# Patient Record
Sex: Male | Born: 1955 | Race: Black or African American | Hispanic: No | Marital: Married | Smoking: Current every day smoker
Health system: Southern US, Community
[De-identification: ages and names within clinical notes are randomized; demographics above are authoritative.]

## PROBLEM LIST (undated history)

## (undated) DIAGNOSIS — C61 Malignant neoplasm of prostate: Secondary | ICD-10-CM

## (undated) DIAGNOSIS — K219 Gastro-esophageal reflux disease without esophagitis: Secondary | ICD-10-CM

## (undated) DIAGNOSIS — M109 Gout, unspecified: Secondary | ICD-10-CM

## (undated) HISTORY — PX: COLONOSCOPY: SHX174

## (undated) HISTORY — PX: PROSTATE BIOPSY: SHX241

---

## 2004-11-04 ENCOUNTER — Ambulatory Visit: Payer: Self-pay | Admitting: Oncology

## 2005-01-05 ENCOUNTER — Ambulatory Visit: Payer: Self-pay | Admitting: Oncology

## 2012-03-21 ENCOUNTER — Ambulatory Visit
Admission: RE | Admit: 2012-03-21 | Discharge: 2012-03-21 | Disposition: A | Discharge status: Home / Self Care | Payer: BC Managed Care – PPO | Source: Ambulatory Visit | Attending: Orthopedic Surgery | Admitting: Orthopedic Surgery

## 2012-03-21 ENCOUNTER — Other Ambulatory Visit: Payer: Self-pay | Admitting: Orthopedic Surgery

## 2012-03-21 DIAGNOSIS — M2560 Stiffness of unspecified joint, not elsewhere classified: Secondary | ICD-10-CM

## 2012-03-21 DIAGNOSIS — M25562 Pain in left knee: Secondary | ICD-10-CM

## 2013-02-25 ENCOUNTER — Encounter (HOSPITAL_COMMUNITY): Payer: Self-pay | Admitting: Emergency Medicine

## 2013-02-25 ENCOUNTER — Emergency Department (HOSPITAL_COMMUNITY)
Admission: EM | Admit: 2013-02-25 | Discharge: 2013-02-25 | Disposition: A | Discharge status: Home / Self Care | Payer: BC Managed Care – PPO | Attending: Emergency Medicine | Admitting: Emergency Medicine

## 2013-02-25 DIAGNOSIS — M109 Gout, unspecified: Secondary | ICD-10-CM

## 2013-02-25 DIAGNOSIS — M25569 Pain in unspecified knee: Secondary | ICD-10-CM | POA: Insufficient documentation

## 2013-02-25 DIAGNOSIS — F17200 Nicotine dependence, unspecified, uncomplicated: Secondary | ICD-10-CM | POA: Insufficient documentation

## 2013-02-25 DIAGNOSIS — M25469 Effusion, unspecified knee: Secondary | ICD-10-CM | POA: Insufficient documentation

## 2013-02-25 HISTORY — DX: Gout, unspecified: M10.9

## 2013-02-25 MED ORDER — OXYCODONE-ACETAMINOPHEN 5-325 MG PO TABS: 2.0000 | ORAL_TABLET | ORAL | Status: DC | PRN

## 2013-02-25 MED ORDER — IBUPROFEN 800 MG PO TABS: 800.0000 mg | ORAL_TABLET | Freq: Three times a day (TID) | ORAL | Status: DC

## 2013-02-25 MED ORDER — COLCHICINE 0.6 MG PO TABS: 0.6000 mg | ORAL_TABLET | Freq: Two times a day (BID) | ORAL | Status: DC | PRN

## 2013-02-25 NOTE — ED Provider Notes (Signed)
CSN: [id number]     Arrival date & time 02/25/13  0524 History   First MD Initiated Contact with Patient 02/25/13 0532     Chief Complaint  Patient presents with  . Leg Pain   (Consider location/radiation/quality/duration/timing/severity/associated sxs/prior Treatment) HPI Comments: Presents to the ER for evaluation of knee pain. Patient reports that he had onset of swelling in the left knee yesterday. Swelling progressively worsened and he is having constant pain in the knee, but the pain shoots up his leg. He says he has a history of gout, but is concerned he might have a blood clot. He has no history of blood clots. Patient denies any direct injury to the area.  Patient is a 58 y.o. male presenting with leg pain.  Leg Pain   Past Medical History  Diagnosis Date  . Gout    History reviewed. No pertinent past surgical history. No family history on file. History  Substance Use Topics  . Smoking status: Current Every Day Smoker  . Smokeless tobacco: Not on file  . Alcohol Use: No    Review of Systems  Musculoskeletal: Positive for arthralgias.  Skin: Negative.     Allergies  Review of patient's allergies indicates no known allergies.  Home Medications  No current outpatient prescriptions on file. BP 149/79  Pulse 106  Temp(Src) 99.2 F (37.3 C) (Oral)  Resp 18  SpO2 96% Physical Exam  Musculoskeletal:       Left knee: He exhibits swelling and effusion. He exhibits normal range of motion, no ecchymosis, no deformity, no laceration and no erythema. Tenderness found.  Neurological: No cranial nerve deficit or sensory deficit.  Skin: Skin is warm, dry and intact. No ecchymosis and no rash noted. No erythema.    ED Course  Procedures (including critical care time) Labs Review Labs Reviewed - No data to display Imaging Review No results found.  EKG Interpretation   None       MDM  Diagnosis: Gout, left knee  Patient presents to the ER for evaluation of pain  and swelling of the left knee. Patient has a history of gout, predominantly in his feet in the past. He takes allopurinol daily. Patient's examination reveals moderate effusion and generalized tenderness with normal range of motion. No increased warmth, no overlying erythema. There is no concern for septic joint. Symptoms are most consistent with acute gout. Patient has no lower extremity tenderness, swelling. Negative Homans sign. No concern for DVT.  Patient reports that he has "borderline diabetes". Corticosteroids will therefore not be prescribed. Patient will be placed on ibuprofen 800 mg, Percocet as needed for pain, colchicine.    Orpah Greek, MD 02/25/13 [phone]

## 2013-02-25 NOTE — Discharge Instructions (Signed)

## 2013-02-25 NOTE — ED Notes (Signed)
Pt. reports left upper thigh pain / swelling onset this morning , denies injury / ambulatory. Pt. stated history of gout.

## 2013-10-19 ENCOUNTER — Other Ambulatory Visit: Payer: Self-pay | Admitting: Orthopedic Surgery

## 2013-10-19 ENCOUNTER — Ambulatory Visit
Admission: RE | Admit: 2013-10-19 | Discharge: 2013-10-19 | Disposition: A | Discharge status: Home / Self Care | Payer: BC Managed Care – PPO | Source: Ambulatory Visit | Attending: Orthopedic Surgery | Admitting: Orthopedic Surgery

## 2013-10-19 DIAGNOSIS — M5430 Sciatica, unspecified side: Secondary | ICD-10-CM

## 2014-07-16 ENCOUNTER — Ambulatory Visit
Admission: RE | Admit: 2014-07-16 | Discharge: 2014-07-16 | Disposition: A | Discharge status: Home / Self Care | Payer: BLUE CROSS/BLUE SHIELD | Source: Ambulatory Visit | Attending: Orthopedic Surgery | Admitting: Orthopedic Surgery

## 2014-07-16 ENCOUNTER — Other Ambulatory Visit: Payer: Self-pay | Admitting: Orthopedic Surgery

## 2014-07-16 DIAGNOSIS — M2392 Unspecified internal derangement of left knee: Secondary | ICD-10-CM

## 2015-10-31 ENCOUNTER — Emergency Department (HOSPITAL_COMMUNITY): Payer: BLUE CROSS/BLUE SHIELD

## 2015-10-31 ENCOUNTER — Emergency Department (HOSPITAL_COMMUNITY)
Admission: EM | Admit: 2015-10-31 | Discharge: 2015-10-31 | Disposition: A | Discharge status: Home / Self Care | Payer: BLUE CROSS/BLUE SHIELD | Attending: Emergency Medicine | Admitting: Emergency Medicine

## 2015-10-31 ENCOUNTER — Encounter (HOSPITAL_COMMUNITY): Payer: Self-pay | Admitting: Vascular Surgery

## 2015-10-31 DIAGNOSIS — R0789 Other chest pain: Secondary | ICD-10-CM | POA: Diagnosis not present

## 2015-10-31 DIAGNOSIS — F17210 Nicotine dependence, cigarettes, uncomplicated: Secondary | ICD-10-CM | POA: Diagnosis not present

## 2015-10-31 LAB — BASIC METABOLIC PANEL
Anion gap: 8 (ref 5–15)
BUN: 7 mg/dL (ref 6–20)
CHLORIDE: 105 mmol/L (ref 101–111)
CO2: 23 mmol/L (ref 22–32)
CREATININE: 1.09 mg/dL (ref 0.61–1.24)
Calcium: 9.2 mg/dL (ref 8.9–10.3)
GFR calc Af Amer: >60 mL/min (ref >60)
GFR calc non Af Amer: >60 mL/min (ref >60)
Glucose, Bld: 113 mg/dL — ABNORMAL HIGH (ref 65–99)
POTASSIUM: 4.1 mmol/L (ref 3.5–5.1)
Sodium: 136 mmol/L (ref 135–145)

## 2015-10-31 LAB — I-STAT TROPONIN, ED
TROPONIN I, POC: 0 ng/mL (ref 0.00–0.08)
Troponin i, poc: 0 ng/mL (ref 0.00–0.08)

## 2015-10-31 LAB — CBC
HEMATOCRIT: 40.8 % (ref 39.0–52.0)
Hemoglobin: 13.9 g/dL (ref 13.0–17.0)
MCH: 33.2 pg (ref 26.0–34.0)
MCHC: 34.1 g/dL (ref 30.0–36.0)
MCV: 97.4 fL (ref 78.0–100.0)
PLATELETS: 198 10*3/uL (ref 150–400)
RBC: 4.19 MIL/uL — ABNORMAL LOW (ref 4.22–5.81)
RDW: 12.1 % (ref 11.5–15.5)
WBC: 7 10*3/uL (ref 4.0–10.5)

## 2015-10-31 MED ORDER — RANITIDINE HCL 150 MG PO TABS: 150.0000 mg | ORAL_TABLET | Freq: Two times a day (BID) | ORAL | 0 refills | Status: DC

## 2015-10-31 MED ORDER — ASPIRIN 81 MG PO CHEW
81.0000 mg | CHEWABLE_TABLET | Freq: Once | ORAL | Status: AC
  Administered 2015-10-31: 81 mg via ORAL
  Filled 2015-10-31: qty 1

## 2015-10-31 MED ORDER — GI COCKTAIL ~~LOC~~
30.0000 mL | Freq: Once | ORAL | Status: AC
  Administered 2015-10-31: 30 mL via ORAL
  Filled 2015-10-31: qty 30

## 2015-10-31 NOTE — ED Triage Notes (Signed)
Pt reports to the ED for eval of midsternal CP that began on Monday and has been bothering him intermittently. He also reports some generalized fatigue, diaphoresis, and SOB. The pain does not radiate. Describes the pain as a tightness. Denies hx of asthma. Drinking H2O makes the pain better. Denies any aggravating factors. Pt has had similar pain 5-6 years ago and had a negative cardiac workup.

## 2015-10-31 NOTE — ED Provider Notes (Signed)
[facility] DEPT Provider Note  CSN: [id number] Arrival Date & Time: 10/31/15 @ 1054  History    Chief Complaint Chief Complaint  Patient presents with  . Chest Pain    HPI Lawrence Cummings is a 60 y.o. male.  Presents for CP that started Monday and has been intermittent until now. Described as tightness in middle chest. Does not radiate to arms or back. Not exertional, and rest does not change symptoms.   Symptoms are worse when laying down to bed and awakes the patient. When he awakes tastes metallic "reflux like taste" in his mouth. Episodes last approximately 10 minutes. Water makes better. Eating spicy food or pizza makes worse or cheese late at night. Associated belching.  Denies cough, fevers or chills, abdominal pain, emesis, urinary symptoms weakness, syncope or AMS.  Previous history of Gout and is on colchicine.  Stress testing was performed approximately 5-6 years ago that showed no disease per the patient.   Drinks 4-5 16 oz beers per day.  Past Medical & Surgical History    Past Medical History:  Diagnosis Date  . Gout    There are no active problems to display for this patient.  History reviewed. No pertinent surgical history.  Family & Social History    History reviewed. No pertinent family history. Social History  Substance Use Topics  . Smoking status: Current Every Day Smoker    Packs/day: 1.00    Types: Cigarettes  . Smokeless tobacco: Never Used  . Alcohol use Yes     Comment: 2-3 beers/day    Home Medications    Prior to Admission medications   Medication Sig Start Date End Date Taking? Authorizing Provider  allopurinol (ZYLOPRIM) 300 MG tablet Take 300 mg by mouth daily.   Yes Historical Provider, MD  etodolac (LODINE) 400 MG tablet Take 400 mg by mouth 2 (two) times daily.   Yes Historical Provider, MD  tetrahydrozoline 0.05 % ophthalmic solution Place 1 drop into both eyes daily as needed (dry eyes).   Yes Historical Provider, MD    colchicine 0.6 MG tablet Take 1 tablet (0.6 mg total) by mouth 2 (two) times daily as needed (gout). Patient not taking: Reported on 10/31/2015 02/25/13   Orpah Greek, MD  ibuprofen (ADVIL,MOTRIN) 800 MG tablet Take 1 tablet (800 mg total) by mouth 3 (three) times daily. Patient not taking: Reported on 10/31/2015 02/25/13   Orpah Greek, MD  oxyCODONE-acetaminophen (PERCOCET) 5-325 MG per tablet Take 2 tablets by mouth every 4 (four) hours as needed. Patient not taking: Reported on 10/31/2015 02/25/13   Orpah Greek, MD    Allergies    Review of patient's allergies indicates no known allergies.  I reviewed & agree with nursing's documentation on the patient's past medical, surgical, social & family histories as well as their allergies.  Review of Systems  Complete ROS obtained, and is negative except as stated in HPI.  Physical Exam  Updated Vital Signs BP 132/88 (BP Location: Left Arm)   Pulse 70   Temp 98.4 F (36.9 C) (Oral)   Resp 17   Ht 6' (1.829 m)   Wt 95.3 kg   SpO2 98%   BMI 28.48 kg/m  I have reviewed the triage vital signs and the nursing notes. Physical Exam CONST: Patient alert, well appearing, in no apparent distress.  EYES: PERRLA. EOMI. Conjunctiva w/o d/c. Lids AT w/o swelling.  ENMT: External Nares & Ears AT w/o swelling. Oropharynx patent. MM moist.  NECK: ROM full w/o rigidity. Trachea midline. JVD absent.  CVS: +S1/S2 w/o obvious murmur. Lower extremities w/o pitting edema.  RESP: Respiratory effort unlabored w/o retractions & accessory muscle use. BS clear bilaterally.  GI: Soft & ND. +BS x 4. TTP absent. Hernia absent. Guarding & Rebound absent.  BACK: CVA TTP absent bilaterally.  SKIN: Skin warm & dry. Turgor good. No rash.  PSYCH: Alert. Oriented. Affect and mood appropriate.  NEURO: CN II-XII grossly intact. Motor exam symmetric w/ upper & lower extremities 5/5 bilaterally. Sensation grossly intact.  MSK: Joints located &  stable, w/o obvious dislocation & obvious deformity or crepitus absent w/ Cap refill < 2 sec. Peripheral pulses 2+ & equal in all extremities.   ED Treatments & Results   Labs (only abnormal results are displayed) Labs Reviewed  BASIC METABOLIC PANEL - Abnormal; Notable for the following:       Result Value   Glucose, Bld 113 (*)    All other components within normal limits  CBC - Abnormal; Notable for the following:    RBC 4.19 (*)    All other components within normal limits  I-STAT TROPOININ, ED    EKG    EKG Interpretation  Date/Time:  Friday October 31 2015 10:58:35 EDT Ventricular Rate:  94 PR Interval:  142 QRS Duration: 76 QT Interval:  340 QTC Calculation: 425 R Axis:   85 Text Interpretation:  Normal sinus rhythm with sinus arrhythmia Cannot rule out Anterior infarct , age undetermined Abnormal ECG No old tracing to compare Confirmed by CAMPOS  MD, Lennette Bihari ([id number]) on 10/31/2015 3:58:38 PM       Radiology Dg Chest 2 View  Result Date: 10/31/2015 CLINICAL DATA:  Chest tightness. EXAM: CHEST  2 VIEW COMPARISON:  No recent . FINDINGS: Mediastinum and hilar structures are normal. Lungs are clear. No pleural effusion or pneumothorax. Thoracic spine scoliosis. No acute bony abnormality IMPRESSION: No acute cardiopulmonary disease. Electronically Signed   By: Marcello Moores  Register   On: 10/31/2015 11:57    Pertinent labs & imaging results that were available during my care of the patient were independently visualized by me and considered in my medical decision making, please see chart for details.  Procedures (including critical care time) Procedures  Medications Ordered in ED Medications - No data to display  Initial Impression & Plan / ED Course & Results / Final Disposition   Initial Impression & Plan Patient presents to the emergency department for assessment of chest pain that is described as atypical in does not appear to be anginal in symptomatology. Per cardiac and  lung exam along with exam of the extremities patient has no concerning findings. I considered and do not believe patient is high risk for aortic dissection and per abdominal exam do not have concern for pancreatitis or AAA.  Given no SOB endorsed and lack of other risk factors, I do not believe Pulmonary Embolism is likely.  HEAR Clinical Decision Tool used for ACS Risk Stratification. Their Risk Score was 3. (A HEAR Score of 0-3 is Low, a Score of 4-8 is High)  Given patient has no known CAD, risk stratification of the patient's H&P, risk factors & ECG was performed. I also obtained serial cardiac troponin to further evaluate for ACS. In light of above, I believe their risk for ACS to be low. Therefore through shared decision making with the patient, will consider for discharge if troponin remain undetectable. Patient in agreement.  ED Course & Results Labs unremarkable troponin  2 also remains undetectable and chest x-ray reveals no acute concern.  Final Disposition Reassessment of the patient reveals no further concerns. Symptoms resolved w/ GI cocktail. ASA 324 mg provided and strict follow up precautions for close chest pain reassessment and consideration of repeat stress testing in 2-3 days given.  ED Course in its entirety, care plan & clinical impressions w/ associated risks were reviewed w/ the patient. In light of the patient's reassuring evaluation above, I consider discharge disposition reasonable. They are in agreement. I gave my typical, strict return precautions in simple, non-technical language. We also discussed symptoms that are most concerning & would necessitate emergent return. I explicitly told them to immediately return to the ED if new symptoms develop, if worse, or for ANY concern. Treatments & follow up plan agreed upon & I confirmed all concerns & questions were addressed prior to discharge.  Follow Up [place] [address] [id number] [place] [phone] Go to  FOR ANY CONCERNS OR IF [facility] AND WELLNESS [place] [phone] Schedule an appointment as soon as possible for a visit in 1 day For symptomatic reassessment and stress testing for chest pain and follow up for reflux symtpoms   New Prescriptions Discharge Medication List as of 10/31/2015  6:23 PM    START taking these medications   Details  ranitidine (ZANTAC) 150 MG tablet Take 1 tablet (150 mg total) by mouth 2 (two) times daily., Starting Fri 10/31/2015, Print       Final Clinical Impression & ED Diagnoses  No diagnosis found. Patient care discussed with the attending physician, Dr. Venora Maples, who oversaw their evaluation & treatment & voiced agreement.  Note: This document was prepared using Dragon voice recognition software and may include unintentional dictation errors.  House Officer: Voncille Lo, MD, Emergency Medicine Resident.   Voncille Lo, MD 10/31/15 [place], MD 11/01/15 [phone]

## 2015-10-31 NOTE — ED Notes (Signed)
Patient informed of plan of care.  Requested to get dressed to go. Patient in no obvious sign of distress.  Will continue to monitor

## 2016-02-10 HISTORY — PX: COLONOSCOPY: SHX174

## 2016-02-10 LAB — HM COLONOSCOPY

## 2017-04-28 ENCOUNTER — Other Ambulatory Visit: Payer: Self-pay | Admitting: Urology

## 2017-04-28 DIAGNOSIS — C61 Malignant neoplasm of prostate: Secondary | ICD-10-CM

## 2017-05-10 ENCOUNTER — Ambulatory Visit (HOSPITAL_COMMUNITY)
Admission: RE | Admit: 2017-05-10 | Discharge: 2017-05-10 | Disposition: A | Discharge status: Home / Self Care | Payer: BLUE CROSS/BLUE SHIELD | Source: Ambulatory Visit | Attending: Urology | Admitting: Urology

## 2017-05-10 DIAGNOSIS — C61 Malignant neoplasm of prostate: Secondary | ICD-10-CM | POA: Diagnosis present

## 2017-05-10 MED ORDER — AXUMIN (FLUCICLOVINE F 18) INJECTION
10.8700 | Freq: Once | INTRAVENOUS | Status: AC
  Administered 2017-05-10: 10.87 via INTRAVENOUS

## 2017-06-13 ENCOUNTER — Encounter: Payer: Self-pay | Admitting: Medical Oncology

## 2017-06-13 ENCOUNTER — Telehealth: Payer: Self-pay | Admitting: Medical Oncology

## 2017-06-13 NOTE — Telephone Encounter (Signed)
Left message requesting a return call to discuss referral to the Prostate [place].

## 2017-06-16 ENCOUNTER — Telehealth: Payer: Self-pay | Admitting: Medical Oncology

## 2017-06-16 NOTE — Telephone Encounter (Signed)
Mr. Lawrence Cummings returned my call regarding referral to the Prostate [place]. I introduced myself as the Prostate Nurse Navigator and the Coordinator of the Prostate [facility].  1. I confirmed with the patient he is aware of his referral to the clinic 06/21/17 arriving at 12:30 pm.  2. I discussed the format of the clinic and the physicians he will be seeing that day.  3. I discussed where the clinic is located and how to contact me.  4. I confirmed his address and informed him I would be mailing a packet of information and forms to be completed. I asked him to bring them with him the day of his appointment.   He voiced understanding of the above. I asked him to call me if he has any questions or concerns regarding his appointments or the forms he needs to complete.

## 2017-06-17 ENCOUNTER — Encounter: Payer: Self-pay | Admitting: Radiation Oncology

## 2017-06-17 NOTE — Progress Notes (Signed)
GU Location of Tumor / Histology: prostatic adenocarcinoma  If Prostate Cancer, Gleason Score is (4 + 5) and PSA is (7.97). Prostate volume: 15.99 grams.   Jaxten Brosh was referred in February 2019 to Dr. Junious Silk for further evaluation of an elevated PSA.   Biopsies of prostate (if applicable) revealed:    Past/Anticipated interventions by urology, if any: biopsy, referral to [facility]  Past/Anticipated interventions by medical oncology, if any: no  Weight changes, if any: no  Bowel/Bladder complaints, if any: frequency, nocturia   Nausea/Vomiting, if any: no  Pain issues, if any:  no  SAFETY ISSUES:  Prior radiation? no  Pacemaker/ICD? no  Possible current pregnancy? no  Is the patient on methotrexate? no  Current Complaints / other details:  62 year old male. Married with one son and one daughter. Uncle had prostate cancer.

## 2017-06-21 ENCOUNTER — Ambulatory Visit
Admission: RE | Admit: 2017-06-21 | Discharge: 2017-06-21 | Disposition: A | Discharge status: Home / Self Care | Payer: BLUE CROSS/BLUE SHIELD | Source: Ambulatory Visit | Attending: Radiation Oncology | Admitting: Radiation Oncology

## 2017-06-21 ENCOUNTER — Encounter: Payer: Self-pay | Admitting: General Practice

## 2017-06-21 ENCOUNTER — Encounter: Payer: Self-pay | Admitting: Medical Oncology

## 2017-06-21 ENCOUNTER — Telehealth: Payer: Self-pay | Admitting: Medical Oncology

## 2017-06-21 ENCOUNTER — Other Ambulatory Visit: Payer: Self-pay

## 2017-06-21 ENCOUNTER — Encounter: Payer: Self-pay | Admitting: Radiation Oncology

## 2017-06-21 ENCOUNTER — Inpatient Hospital Stay (HOSPITAL_BASED_OUTPATIENT_CLINIC_OR_DEPARTMENT_OTHER): Payer: BLUE CROSS/BLUE SHIELD | Admitting: Oncology

## 2017-06-21 DIAGNOSIS — M109 Gout, unspecified: Secondary | ICD-10-CM | POA: Insufficient documentation

## 2017-06-21 DIAGNOSIS — C61 Malignant neoplasm of prostate: Secondary | ICD-10-CM

## 2017-06-21 DIAGNOSIS — Z8042 Family history of malignant neoplasm of prostate: Secondary | ICD-10-CM

## 2017-06-21 DIAGNOSIS — Z800 Family history of malignant neoplasm of digestive organs: Secondary | ICD-10-CM | POA: Insufficient documentation

## 2017-06-21 DIAGNOSIS — K219 Gastro-esophageal reflux disease without esophagitis: Secondary | ICD-10-CM | POA: Insufficient documentation

## 2017-06-21 DIAGNOSIS — F17210 Nicotine dependence, cigarettes, uncomplicated: Secondary | ICD-10-CM | POA: Insufficient documentation

## 2017-06-21 DIAGNOSIS — Z79899 Other long term (current) drug therapy: Secondary | ICD-10-CM

## 2017-06-21 HISTORY — DX: Gastro-esophageal reflux disease without esophagitis: K21.9

## 2017-06-21 HISTORY — DX: Malignant neoplasm of prostate: C61

## 2017-06-21 NOTE — Progress Notes (Signed)
[facility] Psychosocial Distress Screening Spiritual Care  Met with [name] and his wife in [facility] to introduce [facility] team/resources, reviewing distress screen per protocol.  The patient scored a 1 on the Psychosocial Distress Thermometer which indicates mild distress. Also assessed for distress and other psychosocial needs.   ONCBCN DISTRESS SCREENING 06/21/2017  Screening Type Initial Screening  Distress experienced in past week (1-10) 1  Referral to support programs Yes   [name] reports little distress. Couple has grown children, who are part of couple's meaning-making and enjoyment of life. Per pt, he prefers taking action to focusing on distress.   Follow up needed: No. Per couple, no other needs at this time. They are aware ongoing [facility] team/programming availability, but please also page if needs arise or circumstances change. Thank you.   [place], [place], [facility] Pager [phone] Voicemail [phone]

## 2017-06-21 NOTE — Progress Notes (Signed)
Reason for Referral: Prostate cancer  HPI: 62 year old gentleman native of [place] but currently lives in [place].  He has a history of gout but no significant comorbid conditions.  He was noted to have elevated PSA that is asymptomatic.  He was found to have a PSA of 7.97 and was referred to Dr. Junious Silk subsequently.  He underwent a prostate biopsy on April 19, 2017.  At that time he was found to have a Gleason score 4+5 = 9 at the apex with 3 other cores of Gleason score 4+4 = 8 all on the right side.  He underwent a staging CT scan with Axumin PET scan on May 10, 2017 which showed no evidence of metastatic disease.  He remained asymptomatic at this time including no hematuria, dysuria or frequency.  He remains active and continues to work full-time.  He does not report any headaches, blurry vision, syncope or seizures. Does not report any fevers, chills or sweats.  Does not report any cough, wheezing or hemoptysis.  Does not report any chest pain, palpitation, orthopnea or leg edema.  Does not report any nausea, vomiting or abdominal pain.  Does not report any constipation or diarrhea.  Does not report any skeletal complaints.    Does not report frequency, urgency or hematuria.  Does not report any skin rashes or lesions. Does not report any heat or cold intolerance.  Does not report any lymphadenopathy or petechiae.  Does not report any anxiety or depression.  Remaining review of systems is negative.    Past Medical History:  Diagnosis Date  . GERD (gastroesophageal reflux disease)   . Gout   . Gout   . Prostate cancer [facility])   :  Past Surgical History:  Procedure Laterality Date  . PROSTATE BIOPSY    :   Current Outpatient Medications:  .  allopurinol (ZYLOPRIM) 300 MG tablet, Take 300 mg by mouth daily., Disp: , Rfl:  .  benzonatate (TESSALON) 100 MG capsule, TAKE 1 CAPSULE BY MOUTH THREE TIMES A DAY AS NEEDED, Disp: , Rfl: 0 .  chlorpheniramine-HYDROcodone (TUSSIONEX) 10-8  MG/5ML SUER, TAKE 2.5 TO 5ML BY MOUTH EVERY 12 HOURS AS NEEDED, Disp: , Rfl: 0 .  colchicine 0.6 MG tablet, Take 1 tablet (0.6 mg total) by mouth 2 (two) times daily as needed (gout). (Patient not taking: Reported on 10/31/2015), Disp: 20 tablet, Rfl: 0 .  etodolac (LODINE) 400 MG tablet, Take 400 mg by mouth 2 (two) times daily., Disp: , Rfl:  .  fluticasone (FLONASE) 50 MCG/ACT nasal spray, USE 1 SPRAY IN EACH NOSTRIL EVERY DAY, Disp: , Rfl: 0 .  ibuprofen (ADVIL,MOTRIN) 800 MG tablet, Take 1 tablet (800 mg total) by mouth 3 (three) times daily. (Patient not taking: Reported on 10/31/2015), Disp: 21 tablet, Rfl: 0 .  oxyCODONE-acetaminophen (PERCOCET) 5-325 MG per tablet, Take 2 tablets by mouth every 4 (four) hours as needed. (Patient not taking: Reported on 10/31/2015), Disp: 20 tablet, Rfl: 0 .  ranitidine (ZANTAC) 150 MG tablet, Take 1 tablet (150 mg total) by mouth 2 (two) times daily., Disp: 60 tablet, Rfl: 0 .  tetrahydrozoline 0.05 % ophthalmic solution, Place 1 drop into both eyes daily as needed (dry eyes)., Disp: , Rfl: :  No Known Allergies:  Family History  Problem Relation Age of Onset  . Prostate cancer Paternal Uncle   :  Social History   Socioeconomic History  . Marital status: Married    Spouse name: Not on file  . Number  of children: 2  . Years of education: Not on file  . Highest education level: Not on file  Occupational History    Comment: manager  Social Needs  . Financial resource strain: Not on file  . Food insecurity:    Worry: Not on file    Inability: Not on file  . Transportation needs:    Medical: Not on file    Non-medical: Not on file  Tobacco Use  . Smoking status: Current Every Day Smoker    Packs/day: 1.00    Types: Cigarettes  . Smokeless tobacco: Never Used  Substance and Sexual Activity  . Alcohol use: Yes    Comment: 2-3 beers/day  . Drug use: No  . Sexual activity: Not on file  Lifestyle  . Physical activity:    Days per week: Not  on file    Minutes per session: Not on file  . Stress: Not on file  Relationships  . Social connections:    Talks on phone: Not on file    Gets together: Not on file    Attends religious service: Not on file    Active member of club or organization: Not on file    Attends meetings of clubs or organizations: Not on file    Relationship status: Not on file  . Intimate partner violence:    Fear of current or ex partner: Not on file    Emotionally abused: Not on file    Physically abused: Not on file    Forced sexual activity: Not on file  Other Topics Concern  . Not on file  Social History Narrative  . Not on file  :  Pertinent items are noted in HPI.  Exam: ECOG 0 General appearance: alert and cooperative appeared without distress. Head: atraumatic without any abnormalities. Eyes: conjunctivae/corneas clear. PERRL.  Sclera anicteric. Throat: lips, mucosa, and tongue normal; without oral thrush or ulcers. Resp: clear to auscultation bilaterally without rhonchi, wheezes or dullness to percussion. Cardio: regular rate and rhythm, S1, S2 normal, no murmur, click, rub or gallop GI: soft, non-tender; bowel sounds normal; no masses,  no organomegaly Skin: Skin color, texture, turgor normal. No rashes or lesions Lymph nodes: Cervical, supraclavicular, and axillary nodes normal. Neurologic: Grossly normal without any motor, sensory or deep tendon reflexes. Musculoskeletal: No joint deformity or effusion.  CBC    Component Value Date/Time   WBC 7.0 10/31/2015 1113   RBC 4.19 (L) 10/31/2015 1113   HGB 13.9 10/31/2015 1113   HCT 40.8 10/31/2015 1113   PLT 198 10/31/2015 1113   MCV 97.4 10/31/2015 1113   MCH 33.2 10/31/2015 1113   MCHC 34.1 10/31/2015 1113   RDW 12.1 10/31/2015 1113     Chemistry      Component Value Date/Time   NA 136 10/31/2015 1113   K 4.1 10/31/2015 1113   CL 105 10/31/2015 1113   CO2 23 10/31/2015 1113   BUN 7 10/31/2015 1113   CREATININE 1.09  10/31/2015 1113      Component Value Date/Time   CALCIUM 9.2 10/31/2015 1113      Assessment and Plan:   62 year old man with prostate cancer diagnosed in March 2019.  His PSA was 7.97 with a Gleason score 4+5 = 9 and one core and 5 other cores of Gleason score 8.  He is tumors predominant on the right side with negative imaging studies indicating prostate cancer confined to the prostate.  The natural course of this disease was reviewed today with  the patient after he was discussed in the prostate cancer multidisciplinary clinic.  His pathology was discussed with the reviewing pathologist and PET scan was reviewed with radiology.  He represents high risk prostate cancer and treatment options include primary surgical therapy versus radiation and long-term androgen deprivation.  The rationale for using both approaches were reviewed today as well as risks and benefits.  Treatment options would be curative at this time.  He understands without treatment, this cancer can progress into a metastatic setting including disease in the bone lymph nodes.  At that time, treatment would be palliative although treatment options do exist.  After discussion today, he is leaning towards primary surgical therapy.  This might provide advantage as a might give a potential for radiation to be done adjuvantly and adequate prognostication.  The role of androgen deprivation therapy with radiation was also reviewed.  Complications include hot flashes, weight gain, sexual dysfunction among others were reviewed.  After discussion today, he will make his decision in the near future but leaning toward surgery.  All his questions were answered today to his satisfaction.  30  minutes was spent with the patient face-to-face today.  More than 50% of time was dedicated to patient counseling, education and coordination of his care.

## 2017-06-21 NOTE — Consult Note (Signed)
Multi-Disciplinary Clinic 06/21/2017    Lawrence Cummings         MRN: [id number]  PRIMARY CARE:    DOB: 1955-05-04, 62 year old Male  REFERRING:  Waldemar Dickens, MD  SSN: [id number]  PROVIDER:  Festus Aloe, M.D.    TREATING:  Raynelle Bring, M.D.    LOCATION:  [facility] [phone]    CC/HPI: CC: Prostate Cancer   Physician requesting consult: Dr. Eda Keys  PCP: Dr. Linna Darner  Location of consult: [facility]   Lawrence Cummings is a 62 year old gentleman who was noted to have a rapidly rising PSA, increasing from 0.7 in 2015 to 2.2 in 2018 up to 7.97 in 2019. He underwent a TRUS biopsy by Dr. 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   Family history: None.   Imaging studies: Fluciclovine PET (05/10/17) - negative for metastatic disease   PMH: He has a history of GERD and gout.  PSH: No abdominal surgeries.   TNM stage: T1c N0 M0  PSA: 7.97  Gleason score: 4+5=9  Biopsy (04/19/17): 6/12 cores positive  Left: Benign  Right: R apex (10%, 4+4=8), R lateral apex (60%, 4+5=9), R mid (20%, 4+4=8), R lateral mid (10%, 4+4=8), R base (20%, 4+4=8), R lateral base (5%, 4+4=8)  Prostate volume: 16.0 cc   Nomogram  OC disease: 22%  EPE: 75%  SVI: 21%  LNI: 20%  PFS (5 year, 10 year): 42%, 28%   Urinary function: IPSS is 8.  Erectile function: SHIM score is 21.     ALLERGIES: No Allergies    MEDICATIONS: No Medications    GU PSH: Prostate Needle Biopsy - 04/19/2017    NON-GU PSH: Surgical Pathology, Gross And Microscopic Examination For Prostate Needle - 04/19/2017         PMH: Prostate Cancer - 06/08/2017 Elevated PSA - 04/19/2017, - 03/21/2017 BPH w/o LUTS - 03/21/2017    NON-GU PMH: GERD Gout     FAMILY HISTORY: Death of family member - Father     Notes: 1 son; 1 daughter    SOCIAL HISTORY:  Marital Status: Married Preferred Language: English; Ethnicity: Not Hispanic Or Latino; Race: Black or African American Current Smoking Status: Patient smokes. Has smoked since 02/26/1967. Smokes less than 1/2 pack per day.  <DIV'  Tobacco Use Assessment Completed:  Used Tobacco in last 30 days?   Drinks 3 drinks per day.  Drinks 4+ caffeinated drinks per day. Patient's occupation Youth worker.     REVIEW OF SYSTEMS:     GU Review Male:  Patient denies frequent urination, hard to postpone urination, burning/ pain with urination, get up at night to urinate, leakage of urine, stream starts and stops, trouble starting your streams, and have to strain to urinate .    Gastrointestinal (Upper):  Patient denies nausea and vomiting.    Gastrointestinal (Lower):  Patient denies diarrhea and constipation.    Constitutional:  Patient denies fever, night sweats, weight loss, and fatigue.    Skin:  Patient denies skin rash/ lesion and itching.    Eyes:  Patient denies blurred vision and double vision.    Ears/ Nose/ Throat:  Patient denies sore throat and sinus problems.    Hematologic/Lymphatic:  Patient denies swollen glands and easy bruising.    Cardiovascular:  Patient denies leg swelling and  chest pains.    Respiratory:  Patient denies cough and shortness of breath.    Endocrine:  Patient denies excessive thirst.    Musculoskeletal:  Patient denies back pain and joint pain.    Neurological:  Patient denies dizziness and headaches.    Psychologic:  Patient denies depression and anxiety.    VITAL SIGNS: None    GU PHYSICAL EXAMINATION:     Prostate: Prostate about 30 grams. Left lobe normal consistency, right lobe normal consistency. Symmetrical lobes. No prostate nodule. Left lobe no tenderness, right lobe no tenderness.     MULTI-SYSTEM PHYSICAL EXAMINATION:     Constitutional: Well-nourished. No physical deformities. Normally developed. Good grooming.    Neck: Neck symmetrical, not swollen.  Normal tracheal position.    Respiratory: No labored breathing, no use of accessory muscles. Normal breath sounds. Clear bilaterally.    Cardiovascular: Regular rate and rhythm. No murmur, no gallop. Normal temperature, normal extremity pulses, no swelling, no varicosities.    Lymphatic: No enlargement of neck, axillae, groin.    Skin: No paleness, no jaundice, no cyanosis. No lesion, no ulcer, no rash.    Neurologic / Psychiatric: Oriented to time, oriented to place, oriented to person. No depression, no anxiety, no agitation.    Gastrointestinal: No mass, no tenderness, no rigidity, non obese abdomen.    Eyes: Normal conjunctivae. Normal eyelids.    Ears, Nose, Mouth, and Throat: Left ear no scars, no lesions, no masses. Right ear no scars, no lesions, no masses. Nose no scars, no lesions, no masses. Normal hearing. Normal lips.    Musculoskeletal: Normal gait and station of head and neck.          PAST DATA REVIEWED:   Source Of History:  Patient  Urine Test Review:  Urinalysis    03/21/17  PSA  Total PSA 7.97 ng/mL  Free PSA 0.39 ng/mL  % Free PSA 5 % PSA    PROCEDURES: None   ASSESSMENT:     ICD-10 Details  1 GU:  Prostate Cancer - C61    PLAN:   Document  Letter(s):  Created for Patient: Clinical Summary   Notes:  1. Prostate cancer: I had a detailed discussion with Lawrence Cummings and his wife today regarding his prostate cancer diagnosis and treatment for high risk disease. The patient was counseled about the natural history of prostate cancer and the standard treatment options that are available for prostate cancer. It was explained to him how his age and life expectancy, clinical stage, Gleason score, and PSA affect his prognosis, the decision to proceed with additional staging studies, as well as how that information influences recommended treatment strategies. We discussed the roles for active surveillance, radiation therapy, surgical therapy, androgen deprivation, as well  as ablative therapy options for the treatment of prostate cancer as appropriate to his individual cancer situation. We discussed the risks and benefits of these options with regard to their impact on cancer control and also in terms of potential adverse events, complications, and impact on quality of life particularly related to urinary and sexual function. The patient was encouraged to ask questions throughout the discussion today and all questions were answered to his stated satisfaction. In addition, the patient was provided with and/or directed to appropriate resources and literature for further education about prostate cancer and treatment options. We discussed surgical therapy for prostate cancer including the different available surgical approaches. We discussed, in detail, the risks and expectations of surgery with regard to cancer control,  urinary control, and erectile function as well as the expected postoperative recovery process. Additional risks of surgery including but not limited to bleeding, infection, hernia formation, nerve damage, lymphocele formation, bowel/rectal injury potentially necessitating colostomy, damage to the urinary tract resulting in urine leakage, urethral stricture, and the cardiopulmonary risks such as myocardial infarction, stroke, death, venothromboembolism, etc. were explained. The risk of open surgical conversion for robotic/laparoscopic prostatectomy was also discussed.   He has met with both Dr. Tammi Klippel and Dr. Alen Blew. He has elected to proceed with surgical therapy. He will be scheduled for a unilateral left nerve-sparing robot assisted laparoscopic radical prostatectomy and bilateral pelvic lymphadenectomy.   Cc: Dr. Festus Aloe  Dr. Linna Darner

## 2017-06-21 NOTE — Progress Notes (Signed)
                               Care Plan Summary  Name: Lawrence Cummings, Lawrence Cummings. DOB: [birth year]   Your Medical Team:   Urologist -  Dr. Raynelle Bring, [facility]  Radiation Oncologist - Dr. Tyler Pita, [facility]   Medical Oncologist - Dr. Zola Button, [facility]  Recommendations: 1) Robotic prostatectomy  2) Brachytherapy (seed implant)  3) Radiation  * These recommendations are based on information available as of today's consult.      Recommendations may change depending on the results of further tests or exams.  Next Steps: 1) Dr. Lynne Logan office will call to schedule surgery     When appointments need to be scheduled, you will be contacted by [facility] and/or [facility].  Provided with business cards for all team members and a copy of "Fall Prevention Patient Safety Plan".  Questions?  Please do not hesitate to call Lawrence Rue, RN, BSN, OCN at [phone] any questions or concerns.  Lawrence Cummings is your Oncology Nurse Navigator and is available to assist you while you're receiving your medical care at [facility].

## 2017-06-21 NOTE — Progress Notes (Signed)
Radiation Oncology         [phone] ________________________________  Multidisciplinary Prostate Cancer Clinic  Initial Radiation Oncology Consultation  Name: Lawrence Cummings MRN: [id number]  Date: 06/21/2017  DOB: September 23, 1955  ZJ:IRCVELF, No Pcp Per  Raynelle Bring, MD   REFERRING PHYSICIAN: Raynelle Bring, MD  DIAGNOSIS: 62 y.o. gentleman with stage T1c adenocarcinoma of the prostate with a Gleason's score of 4+5 and a PSA of 7.97    ICD-10-CM   1. Malignant neoplasm of prostate ([place]) C61     HISTORY OF PRESENT ILLNESS::Lawrence Cummings is a 61 y.o. gentleman.  He is an established patient of Dr. Festus Aloe followed for history of elevated PSA.  He was noted to have an elevated PSA of 6.3 in 01/2017, (prior PSA had been 2.2 in 2018) by his primary care physician, Dr. Saralyn Pilar.  Accordingly, he was referred for evaluation in urology by Dr. Junious Silk on 03/21/17,  digital rectal examination was performed at that time revealing an enlarged gland with symmetrical lobes and no discrete nodularity.  Repeat PSA was performed at that time and was further elevated at 7.97.  Therefore, the patient proceeded to transrectal ultrasound with 12 biopsies of the prostate on 04/19/17.  The prostate volume measured 16 cc.  Out of 12 core biopsies,6 were positive, all on the right.  The maximum Gleason score was 4+5, and this was seen in the right apex lateral.  Additionally, Gleason 4+4 disease was seen the other 5 samples from the right lobe prostate.  The patient underwent Axumin PET scan on 05/10/2017 for disease staging and this scan revealed no evidence of lymph node involvement or distant metastatic disease.  He reviewed the biopsy results with his urologist and he has kindly been referred today to the multidisciplinary prostate cancer clinic for presentation of pathology and radiology studies in our conference for discussion of potential radiation treatment options and clinical  evaluation.  PREVIOUS RADIATION THERAPY: No  PAST MEDICAL HISTORY:  has a past medical history of GERD (gastroesophageal reflux disease), Gout, and Prostate cancer ([place]).    PAST SURGICAL HISTORY: Past Surgical History:  Procedure Laterality Date  . PROSTATE BIOPSY      FAMILY HISTORY: family history includes Liver cancer in his father; Prostate cancer in his paternal uncle; Stomach cancer in his maternal grandfather.  SOCIAL HISTORY:  reports that he has been smoking cigarettes.  He has a 15.00 pack-year smoking history. He has never used smokeless tobacco. He reports that he drinks alcohol. He reports that he does not use drugs.  ALLERGIES: Patient has no known allergies.  MEDICATIONS:  No current outpatient medications on file.   No current facility-administered medications for this encounter.     REVIEW OF SYSTEMS:  On review of systems, the patient reports that he is doing well overall.  He denies any chest pain, shortness of breath, cough, fevers, chills, night sweats, unintended weight changes.  He denies any bowel disturbances, and denies abdominal pain, nausea or vomiting.  He denies any new musculoskeletal or joint aches or pains. He  denies any significant lower urinary tract symptoms, specifically denies gross hematuria, dysuria, incomplete emptying or incontinence.[name] Kitchen He is unable to complete sexual activity with most attempts. A complete review of systems is obtained and is otherwise negative.   PHYSICAL EXAM:  Wt Readings from Last 3 Encounters:  06/21/17 201 lb 9.6 oz (91.4 kg)  10/31/15 210 lb (95.3 kg)   Temp Readings from Last 3 Encounters:  06/21/17 99.2  F (37.3 C) (Oral)  10/31/15 98.4 F (36.9 C) (Oral)  02/25/13 99.2 F (37.3 C) (Oral)   BP Readings from Last 3 Encounters:  06/21/17 137/87  10/31/15 146/88  02/25/13 144/79   Pulse Readings from Last 3 Encounters:  06/21/17 (!) 111  10/31/15 62  02/25/13 103   Pain Assessment Pain Score: 0-No  pain/10  In general this is a well appearing African-American male in no acute distress.  He is alert and oriented x4 and appropriate throughout the examination. HEENT reveals that the patient is normocephalic, atraumatic. EOMs are intact. PERRLA. Skin is intact without any evidence of gross lesions. Cardiovascular exam reveals a regular rate and rhythm, no clicks rubs or murmurs are auscultated. Chest is clear to auscultation bilaterally. Lymphatic assessment is performed and does not reveal any adenopathy in the cervical, supraclavicular, axillary, or inguinal chains. Abdomen has active bowel sounds in all quadrants and is intact. The abdomen is soft, non tender, non distended. Lower extremities are negative for pretibial pitting edema, deep calf tenderness, cyanosis or clubbing.  KPS = 100  100 - Normal; no complaints; no evidence of disease. 90   - Able to carry on normal activity; minor signs or symptoms of disease. 80   - Normal activity with effort; some signs or symptoms of disease. 66   - Cares for self; unable to carry on normal activity or to do active work. 60   - Requires occasional assistance, but is able to care for most of his personal needs. 50   - Requires considerable assistance and frequent medical care. 58   - Disabled; requires special care and assistance. 76   - Severely disabled; hospital admission is indicated although death not imminent. 51   - Very sick; hospital admission necessary; active supportive treatment necessary. 10   - Moribund; fatal processes progressing rapidly. 0     - Dead  Karnofsky DA, Abelmann Dolores, Craver LS and Burchenal [facility] [phone]) The use of the nitrogen mustards in the palliative treatment of carcinoma: with particular reference to bronchogenic carcinoma Cancer 1 634-56   LABORATORY DATA:  Lab Results  Component Value Date   WBC 7.0 10/31/2015   HGB 13.9 10/31/2015   HCT 40.8 10/31/2015   MCV 97.4 10/31/2015   PLT 198 10/31/2015   Lab  Results  Component Value Date   NA 136 10/31/2015   K 4.1 10/31/2015   CL 105 10/31/2015   CO2 23 10/31/2015   No results found for: ALT, AST, GGT, ALKPHOS, BILITOT   RADIOGRAPHY: No results found.    IMPRESSION/PLAN: 62 y.o. gentleman with a high risk, stage T1c adenocarcinoma of the prostate with a PSA of 7.97 and a Gleason score of 4+5.  Dr. Tammi Klippel discussed the patient's workup and outlines the nature of prostate cancer in this setting. The patient's T stage, Gleason's score, and PSA put him into the high risk group. Accordingly, he is eligible for a variety of potential treatment options including LT-ADT in combination with either 8 weeks of external radiation or 5 weeks of external radiation followed by a brachytherapy boost. We discussed the available radiation techniques, and focused on the details and logistics and delivery.We discussed and outlined the risks, benefits, short and long-term effects associated with radiotherapy and compared and contrasted these with prostatectomy. We discussed the role of SpaceOAR in reducing the rectal toxicity associated with radiotherapy. We also detailed the role of ADT in the treatment of high risk prostate cancer and outlined the associated  side effects that could be expected with this therapy.  At the conclusion of our conversation today, the patient elects to proceed with radical prostatectomy for treatment of his prostate cancer.  We will share our findings with Dr. Junious Silk and the patient will meet with Dr. Alinda Money for further discussion today in this multidisciplinary clinic.  We did briefly discuss the potential role for post prostatectomy adjuvant/salvage radiotherapy pending final pathology and/or PSA response postoperatively.  He appears to have a good understanding of his disease and treatment options.  We would be happy to continue to participate in the care of this very nice gentleman should he elect to proceed with radiotherapy or should there  be a need for postoperative radiotherapy.    We spent 60 minutes face to face with the patient and more than 50% of that time was spent in counseling and/or coordination of care.     Nicholos Johns, PA-C    Tyler Pita, MD  [facility] Direct Dial: [phone]  Fax: [phone] [email or web address]  Skype  LinkedIn

## 2017-06-21 NOTE — Telephone Encounter (Signed)
Left appointment reminder for Lawrence Cummings today, arriving at 12:30pm. I reviewed location, [place] parking, registration and reminded him to bring his completed medical forms.

## 2017-06-23 ENCOUNTER — Other Ambulatory Visit: Payer: Self-pay | Admitting: Urology

## 2017-06-24 NOTE — Patient Instructions (Addendum)
Lawrence Cummings.  06/24/2017   Your procedure is scheduled on: 06-30-17   Report to [facility] Main  Entrance              Report to admitting at      [place] AM    Call this number if you have problems the morning of surgery [phone]   Remember: Do not eat food or drink liquids :After Midnight.    8 oz magnesium citrate by noon day before surgery               One fleet enema night before surgery    Take these medicines the morning of surgery with A SIP OF WATER: none                                You may not have any metal on your body including hair pins and              piercings  Do not wear jewelry,, lotions, powders or perfumes, deodorant                  Men may shave face and neck.   Do not bring valuables to the hospital. [place].  Contacts, dentures or bridgework may not be worn into surgery.  Leave suitcase in the car. After surgery it may be brought to your room.               Please read over the following fact sheets you were given: _____________________________________________________________________           [facility] - Preparing for Surgery Before surgery, you can play an important role.  Because skin is not sterile, your skin needs to be as free of germs as possible.  You can reduce the number of germs on your skin by washing with CHG (chlorahexidine gluconate) soap before surgery.  CHG is an antiseptic cleaner which kills germs and bonds with the skin to continue killing germs even after washing. Please DO NOT use if you have an allergy to CHG or antibacterial soaps.  If your skin becomes reddened/irritated stop using the CHG and inform your nurse when you arrive at Short Stay. Do not shave (including legs and underarms) for at least 48 hours prior to the first CHG shower.  You may shave your face/neck. Please follow these instructions carefully:  1.  Shower with CHG Soap  the night before surgery and the  morning of Surgery.  2.  If you choose to wash your hair, wash your hair first as usual with your  normal  shampoo.  3.  After you shampoo, rinse your hair and body thoroughly to remove the  shampoo.                           4.  Use CHG as you would any other liquid soap.  You can apply chg directly  to the skin and wash                       Gently with a scrungie or clean washcloth.  5.  Apply the CHG Soap to your body ONLY  FROM THE NECK DOWN.   Do not use on face/ open                           Wound or open sores. Avoid contact with eyes, ears mouth and genitals (private parts).                       Wash face,  Genitals (private parts) with your normal soap.             6.  Wash thoroughly, paying special attention to the area where your surgery  will be performed.  7.  Thoroughly rinse your body with warm water from the neck down.  8.  DO NOT shower/wash with your normal soap after using and rinsing off  the CHG Soap.                9.  Pat yourself dry with a clean towel.            10.  Wear clean pajamas.            11.  Place clean sheets on your bed the night of your first shower and do not  sleep with pets. Day of Surgery : Do not apply any lotions/deodorants the morning of surgery.  Please wear clean clothes to the hospital/surgery center.  FAILURE TO FOLLOW THESE INSTRUCTIONS MAY RESULT IN THE CANCELLATION OF YOUR SURGERY PATIENT SIGNATURE_________________________________  NURSE SIGNATURE__________________________________  ________________________________________________________________________  WHAT IS A BLOOD TRANSFUSION? Blood Transfusion Information  A transfusion is the replacement of blood or some of its parts. Blood is made up of multiple cells which provide different functions.  Red blood cells carry oxygen and are used for blood loss replacement.  White blood cells fight against infection.  Platelets control bleeding.  Plasma  helps clot blood.  Other blood products are available for specialized needs, such as hemophilia or other clotting disorders. BEFORE THE TRANSFUSION  Who gives blood for transfusions?   Healthy volunteers who are fully evaluated to make sure their blood is safe. This is blood bank blood. Transfusion therapy is the safest it has ever been in the practice of medicine. Before blood is taken from a donor, a complete history is taken to make sure that person has no history of diseases nor engages in risky social behavior (examples are intravenous drug use or sexual activity with multiple partners). The donor's travel history is screened to minimize risk of transmitting infections, such as malaria. The donated blood is tested for signs of infectious diseases, such as HIV and hepatitis. The blood is then tested to be sure it is compatible with you in order to minimize the chance of a transfusion reaction. If you or a relative donates blood, this is often done in anticipation of surgery and is not appropriate for emergency situations. It takes many days to process the donated blood. RISKS AND COMPLICATIONS Although transfusion therapy is very safe and saves many lives, the main dangers of transfusion include:   Getting an infectious disease.  Developing a transfusion reaction. This is an allergic reaction to something in the blood you were given. Every precaution is taken to prevent this. The decision to have a blood transfusion has been considered carefully by your caregiver before blood is given. Blood is not given unless the benefits outweigh the risks. AFTER THE TRANSFUSION  Right after receiving a blood transfusion, you will usually feel much better  and more energetic. This is especially true if your red blood cells have gotten low (anemic). The transfusion raises the level of the red blood cells which carry oxygen, and this usually causes an energy increase.  The nurse administering the transfusion  will monitor you carefully for complications. HOME CARE INSTRUCTIONS  No special instructions are needed after a transfusion. You may find your energy is better. Speak with your caregiver about any limitations on activity for underlying diseases you may have. SEEK MEDICAL CARE IF:   Your condition is not improving after your transfusion.  You develop redness or irritation at the intravenous (IV) site. SEEK IMMEDIATE MEDICAL CARE IF:  Any of the following symptoms occur over the next 12 hours:  Shaking chills.  You have a temperature by mouth above 102 F (38.9 C), not controlled by medicine.  Chest, back, or muscle pain.  People around you feel you are not acting correctly or are confused.  Shortness of breath or difficulty breathing.  Dizziness and fainting.  You get a rash or develop hives.  You have a decrease in urine output.  Your urine turns a dark color or changes to pink, red, or brown. Any of the following symptoms occur over the next 10 days:  You have a temperature by mouth above 102 F (38.9 C), not controlled by medicine.  Shortness of breath.  Weakness after normal activity.  The white part of the eye turns yellow (jaundice).  You have a decrease in the amount of urine or are urinating less often.  Your urine turns a dark color or changes to pink, red, or brown. Document Released: 01/09/2000 Document Revised: 04/05/2011 Document Reviewed: 08/28/2007 ExitCare Patient Information 2014 [place].  _______________________________________________________________________  Incentive Spirometer  An incentive spirometer is a tool that can help keep your lungs clear and active. This tool measures how well you are filling your lungs with each breath. Taking long deep breaths may help reverse or decrease the chance of developing breathing (pulmonary) problems (especially infection) following:  A long period of time when you are unable to move or be  active. BEFORE THE PROCEDURE   If the spirometer includes an indicator to show your best effort, your nurse or respiratory therapist will set it to a desired goal.  If possible, sit up straight or lean slightly forward. Try not to slouch.  Hold the incentive spirometer in an upright position. INSTRUCTIONS FOR USE  1. Sit on the edge of your bed if possible, or sit up as far as you can in bed or on a chair. 2. Hold the incentive spirometer in an upright position. 3. Breathe out normally. 4. Place the mouthpiece in your mouth and seal your lips tightly around it. 5. Breathe in slowly and as deeply as possible, raising the piston or the ball toward the top of the column. 6. Hold your breath for 3-5 seconds or for as long as possible. Allow the piston or ball to fall to the bottom of the column. 7. Remove the mouthpiece from your mouth and breathe out normally. 8. Rest for a few seconds and repeat Steps 1 through 7 at least 10 times every 1-2 hours when you are awake. Take your time and take a few normal breaths between deep breaths. 9. The spirometer may include an indicator to show your best effort. Use the indicator as a goal to work toward during each repetition. 10. After each set of 10 deep breaths, practice coughing to be sure your  lungs are clear. If you have an incision (the cut made at the time of surgery), support your incision when coughing by placing a pillow or rolled up towels firmly against it. Once you are able to get out of bed, walk around indoors and cough well. You may stop using the incentive spirometer when instructed by your caregiver.  RISKS AND COMPLICATIONS  Take your time so you do not get dizzy or light-headed.  If you are in pain, you may need to take or ask for pain medication before doing incentive spirometry. It is harder to take a deep breath if you are having pain. AFTER USE  Rest and breathe slowly and easily.  It can be helpful to keep track of a log of  your progress. Your caregiver can provide you with a simple table to help with this. If you are using the spirometer at home, follow these instructions: [place] IF:   You are having difficultly using the spirometer.  You have trouble using the spirometer as often as instructed.  Your pain medication is not giving enough relief while using the spirometer.  You develop fever of 100.5 F (38.1 C) or higher. SEEK IMMEDIATE MEDICAL CARE IF:   You cough up bloody sputum that had not been present before.  You develop fever of 102 F (38.9 C) or greater.  You develop worsening pain at or near the incision site. MAKE SURE YOU:   Understand these instructions.  Will watch your condition.  Will get help right away if you are not doing well or get worse. Document Released: 05/24/2006 Document Revised: 04/05/2011 Document Reviewed: 07/25/2006 [facility] Patient Information 2014 [place], [place].   ________________________________________________________________________

## 2017-06-27 ENCOUNTER — Encounter (HOSPITAL_COMMUNITY)
Admission: RE | Admit: 2017-06-27 | Discharge: 2017-06-27 | Disposition: A | Discharge status: Home / Self Care | Payer: BLUE CROSS/BLUE SHIELD | Source: Ambulatory Visit | Attending: Urology | Admitting: Urology

## 2017-06-27 ENCOUNTER — Other Ambulatory Visit: Payer: Self-pay

## 2017-06-27 ENCOUNTER — Encounter (HOSPITAL_COMMUNITY): Payer: Self-pay

## 2017-06-27 DIAGNOSIS — C61 Malignant neoplasm of prostate: Secondary | ICD-10-CM | POA: Insufficient documentation

## 2017-06-27 DIAGNOSIS — Z01818 Encounter for other preprocedural examination: Secondary | ICD-10-CM | POA: Insufficient documentation

## 2017-06-27 LAB — BASIC METABOLIC PANEL
Anion gap: 11 (ref 5–15)
BUN: 16 mg/dL (ref 6–20)
CO2: 23 mmol/L (ref 22–32)
CREATININE: 1.01 mg/dL (ref 0.61–1.24)
Calcium: 9 mg/dL (ref 8.9–10.3)
Chloride: 107 mmol/L (ref 101–111)
GFR calc Af Amer: >60 mL/min (ref >60)
GLUCOSE: 87 mg/dL (ref 65–99)
Potassium: 4.4 mmol/L (ref 3.5–5.1)
Sodium: 141 mmol/L (ref 135–145)

## 2017-06-27 LAB — CBC
HCT: 41.8 % (ref 39.0–52.0)
Hemoglobin: 14.5 g/dL (ref 13.0–17.0)
MCH: 33.1 pg (ref 26.0–34.0)
MCHC: 34.7 g/dL (ref 30.0–36.0)
MCV: 95.4 fL (ref 78.0–100.0)
Platelets: 215 10*3/uL (ref 150–400)
RBC: 4.38 MIL/uL (ref 4.22–5.81)
RDW: 13.6 % (ref 11.5–15.5)
WBC: 5.5 10*3/uL (ref 4.0–10.5)

## 2017-06-27 LAB — ABO/RH: ABO/RH(D): A POS

## 2017-06-28 ENCOUNTER — Telehealth: Payer: Self-pay | Admitting: Medical Oncology

## 2017-06-28 NOTE — Telephone Encounter (Signed)
Left message as follow up to the [facility]. He is scheduled for robotic prostatectomy 06/30/17. I wished him well and asked him to call if he has questions or concerns regarding his surgery.

## 2017-06-29 NOTE — H&P (Signed)
Multi-Disciplinary Clinic 06/21/2017    Lawrence Cummings         MRN: [id number]  PRIMARY CARE:    DOB: [birth year], 62 year old Male  REFERRING:  Waldemar Dickens, MD  SSN: [id number]  PROVIDER:  Festus Aloe, M.D.    TREATING:  Raynelle Bring, M.D.    LOCATION:  [facility] [phone]    CC/HPI: CC: Prostate Cancer   Physician requesting consult: Dr. Eda Keys  PCP: Dr. Linna Darner  Location of consult: [facility]   Mr. Edgett is a 62 year old gentleman who was noted to have a rapidly rising PSA, increasing from 0.7 in 2015 to 2.2 in 2018 up to 7.97 in 2019. He underwent a TRUS biopsy by Dr. 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   Family history: None.   Imaging studies: Fluciclovine PET (05/10/17) - negative for metastatic disease   PMH: He has a history of GERD and gout.  PSH: No abdominal surgeries.   TNM stage: T1c N0 M0  PSA: 7.97  Gleason score: 4+5=9  Biopsy (04/19/17): 6/12 cores positive  Left: Benign  Right: R apex (10%, 4+4=8), R lateral apex (60%, 4+5=9), R mid (20%, 4+4=8), R lateral mid (10%, 4+4=8), R base (20%, 4+4=8), R lateral base (5%, 4+4=8)  Prostate volume: 16.0 cc   Nomogram  OC disease: 22%  EPE: 75%  SVI: 21%  LNI: 20%  PFS (5 year, 10 year): 42%, 28%   Urinary function: IPSS is 8.  Erectile function: SHIM score is 21.     ALLERGIES: No Allergies    MEDICATIONS: No Medications    GU PSH: Prostate Needle Biopsy - 04/19/2017    NON-GU PSH: Surgical Pathology, Gross And Microscopic Examination For Prostate Needle - 04/19/2017        GU PMH: Prostate Cancer - 06/08/2017 Elevated PSA - 04/19/2017, - 03/21/2017 BPH w/o LUTS - 03/21/2017    NON-GU PMH: GERD Gout     FAMILY HISTORY: Death of family member - Father     Notes: 1 son; 1 daughter    SOCIAL HISTORY:  Marital Status: Married Preferred Language: English; Ethnicity: Not Hispanic Or Latino; Race: Black or African American Current Smoking Status: Patient smokes. Has smoked since 02/26/1967. Smokes less than 1/2 pack per day.  <DIV'  Tobacco Use Assessment Completed:  Used Tobacco in last 30 days?   Drinks 3 drinks per day.  Drinks 4+ caffeinated drinks per day. Patient's occupation Youth worker.     REVIEW OF SYSTEMS:     GU Review Male:  Patient denies frequent urination, hard to postpone urination, burning/ pain with urination, get up at night to urinate, leakage of urine, stream starts and stops, trouble starting your streams, and have to strain to urinate .    Gastrointestinal (Upper):  Patient denies nausea and vomiting.    Gastrointestinal (Lower):  Patient denies diarrhea and constipation.    Constitutional:  Patient denies fever, night sweats, weight loss, and fatigue.    Skin:  Patient denies skin rash/ lesion and itching.    Eyes:  Patient denies blurred vision and double vision.    Ears/ Nose/ Throat:  Patient denies sore throat and sinus problems.    Hematologic/Lymphatic:  Patient denies swollen glands and easy bruising.    Cardiovascular:  Patient denies leg swelling and chest  pains.    Respiratory:  Patient denies cough and shortness of breath.    Endocrine:  Patient denies excessive thirst.    Musculoskeletal:  Patient denies back pain and joint pain.    Neurological:  Patient denies dizziness and headaches.    Psychologic:  Patient denies depression and anxiety.    VITAL SIGNS: None    GU PHYSICAL EXAMINATION:     Prostate: Prostate about 30 grams. Left lobe normal consistency, right lobe normal consistency. Symmetrical lobes. No prostate nodule. Left lobe no tenderness, right lobe no tenderness.     MULTI-SYSTEM PHYSICAL EXAMINATION:     Constitutional: Well-nourished. No physical deformities. Normally developed. Good grooming.    Neck: Neck symmetrical, not swollen.  Normal tracheal position.    Respiratory: No labored breathing, no use of accessory muscles. Normal breath sounds. Clear bilaterally.    Cardiovascular: Regular rate and rhythm. No murmur, no gallop. Normal temperature, normal extremity pulses, no swelling, no varicosities.    Lymphatic: No enlargement of neck, axillae, groin.    Skin: No paleness, no jaundice, no cyanosis. No lesion, no ulcer, no rash.    Neurologic / Psychiatric: Oriented to time, oriented to place, oriented to person. No depression, no anxiety, no agitation.    Gastrointestinal: No mass, no tenderness, no rigidity, non obese abdomen.    Eyes: Normal conjunctivae. Normal eyelids.    Ears, Nose, Mouth, and Throat: Left ear no scars, no lesions, no masses. Right ear no scars, no lesions, no masses. Nose no scars, no lesions, no masses. Normal hearing. Normal lips.    Musculoskeletal: Normal gait and station of head and neck.          PAST DATA REVIEWED:   Source Of History:  Patient  Urine Test Review:  Urinalysis    03/21/17  PSA  Total PSA 7.97 ng/mL  Free PSA 0.39 ng/mL  % Free PSA 5 % PSA    PROCEDURES: None   ASSESSMENT:     ICD-10 Details  1 GU:  Prostate Cancer - C61    PLAN:   Document  Letter(s):  Created for Patient: Clinical Summary   Notes:  1. Prostate cancer: I had a detailed discussion with Mr. Rosenwald and his wife today regarding his prostate cancer diagnosis and treatment for high risk disease. The patient was counseled about the natural history of prostate cancer and the standard treatment options that are available for prostate cancer. It was explained to him how his age and life expectancy, clinical stage, Gleason score, and PSA affect his prognosis, the decision to proceed with additional staging studies, as well as how that information influences recommended treatment strategies. We discussed the roles for active surveillance, radiation therapy, surgical therapy, androgen deprivation, as well  as ablative therapy options for the treatment of prostate cancer as appropriate to his individual cancer situation. We discussed the risks and benefits of these options with regard to their impact on cancer control and also in terms of potential adverse events, complications, and impact on quality of life particularly related to urinary and sexual function. The patient was encouraged to ask questions throughout the discussion today and all questions were answered to his stated satisfaction. In addition, the patient was provided with and/or directed to appropriate resources and literature for further education about prostate cancer and treatment options. We discussed surgical therapy for prostate cancer including the different available surgical approaches. We discussed, in detail, the risks and expectations of surgery with regard to cancer control, urinary  control, and erectile function as well as the expected postoperative recovery process. Additional risks of surgery including but not limited to bleeding, infection, hernia formation, nerve damage, lymphocele formation, bowel/rectal injury potentially necessitating colostomy, damage to the urinary tract resulting in urine leakage, urethral stricture, and the cardiopulmonary risks such as myocardial infarction, stroke, death, venothromboembolism, etc. were explained. The risk of open surgical conversion for robotic/laparoscopic prostatectomy was also discussed.   He has met with both Dr. Tammi Klippel and Dr. Alen Blew. He has elected to proceed with surgical therapy. He will be scheduled for a unilateral left nerve-sparing robot assisted laparoscopic radical prostatectomy and bilateral pelvic lymphadenectomy.   Cc: Dr. Festus Aloe  Dr. Linna Darner    E & M CODE: I spent at least 52 minutes face to face with the patient, more than 50% of that time was spent on counseling and/or coordinating care.   * Signed by Raynelle Bring, M.D. on 06/21/17 at 6:04 PM  (EDT)*

## 2017-06-29 NOTE — Anesthesia Preprocedure Evaluation (Addendum)
Anesthesia Evaluation  Patient identified by MRN, date of birth, ID band Patient awake    Reviewed: Allergy & Precautions, NPO status , Patient's Chart, lab work & pertinent test results  Airway Mallampati: II  TM Distance: >3 FB Neck ROM: Full    Dental  (+) Dental Advisory Given   Pulmonary Current Smoker,    breath sounds clear to auscultation       Cardiovascular negative cardio ROS   Rhythm:Regular Rate:Normal     Neuro/Psych negative neurological ROS  negative psych ROS   GI/Hepatic Neg liver ROS, GERD  Controlled,  Endo/Other  negative endocrine ROS  Renal/GU negative Renal ROS   Prostate cancer    Musculoskeletal Gout   Abdominal   Peds  Hematology negative hematology ROS (+)   Anesthesia Other Findings   Reproductive/Obstetrics                            Anesthesia Physical Anesthesia Plan  ASA: II  Anesthesia Plan: General   Post-op Pain Management:    Induction: Intravenous  PONV Risk Score and Plan: 3 and Treatment may vary due to age or medical condition, Ondansetron, Dexamethasone and Midazolam  Airway Management Planned: Oral ETT  Additional Equipment: None  Intra-op Plan:   Post-operative Plan: Extubation in OR  Informed Consent: I have reviewed the patients History and Physical, chart, labs and discussed the procedure including the risks, benefits and alternatives for the proposed anesthesia with the patient or authorized representative who has indicated his/her understanding and acceptance.   Dental advisory given  Plan Discussed with: CRNA and Anesthesiologist  Anesthesia Plan Comments:         Anesthesia Quick Evaluation

## 2017-06-30 ENCOUNTER — Encounter (HOSPITAL_COMMUNITY): Payer: Self-pay | Admitting: *Deleted

## 2017-06-30 ENCOUNTER — Other Ambulatory Visit: Payer: Self-pay

## 2017-06-30 ENCOUNTER — Observation Stay (HOSPITAL_COMMUNITY)
Admission: RE | Admit: 2017-06-30 | Discharge: 2017-07-02 | Disposition: A | Discharge status: Home / Self Care | Payer: BLUE CROSS/BLUE SHIELD | Source: Ambulatory Visit | Attending: Urology | Admitting: Urology

## 2017-06-30 ENCOUNTER — Ambulatory Visit (HOSPITAL_COMMUNITY): Payer: BLUE CROSS/BLUE SHIELD | Admitting: Anesthesiology

## 2017-06-30 ENCOUNTER — Encounter (HOSPITAL_COMMUNITY)
Admission: RE | Disposition: A | Discharge status: Home / Self Care | Payer: Self-pay | Source: Ambulatory Visit | Attending: Urology

## 2017-06-30 DIAGNOSIS — C61 Malignant neoplasm of prostate: Secondary | ICD-10-CM | POA: Diagnosis not present

## 2017-06-30 DIAGNOSIS — Z79899 Other long term (current) drug therapy: Secondary | ICD-10-CM | POA: Insufficient documentation

## 2017-06-30 DIAGNOSIS — K219 Gastro-esophageal reflux disease without esophagitis: Secondary | ICD-10-CM | POA: Diagnosis not present

## 2017-06-30 DIAGNOSIS — F17200 Nicotine dependence, unspecified, uncomplicated: Secondary | ICD-10-CM | POA: Insufficient documentation

## 2017-06-30 DIAGNOSIS — M109 Gout, unspecified: Secondary | ICD-10-CM | POA: Insufficient documentation

## 2017-06-30 DIAGNOSIS — R9721 Rising PSA following treatment for malignant neoplasm of prostate: Secondary | ICD-10-CM | POA: Diagnosis present

## 2017-06-30 HISTORY — PX: ROBOT ASSISTED LAPAROSCOPIC RADICAL PROSTATECTOMY: SHX5141

## 2017-06-30 HISTORY — PX: LYMPHADENECTOMY: SHX5960

## 2017-06-30 LAB — TYPE AND SCREEN
ABO/RH(D): A POS
ANTIBODY SCREEN: NEGATIVE

## 2017-06-30 LAB — HEMOGLOBIN AND HEMATOCRIT, BLOOD
HCT: 34.6 % — ABNORMAL LOW (ref 39.0–52.0)
Hemoglobin: 12 g/dL — ABNORMAL LOW (ref 13.0–17.0)

## 2017-06-30 SURGERY — XI ROBOTIC ASSISTED LAPAROSCOPIC RADICAL PROSTATECTOMY LEVEL 2
Anesthesia: General

## 2017-06-30 MED ORDER — OXYCODONE HCL 5 MG/5ML PO SOLN
5.0000 mg | Freq: Once | ORAL | Status: DC | PRN
  Filled 2017-06-30: qty 5

## 2017-06-30 MED ORDER — PROPOFOL 10 MG/ML IV BOLUS
INTRAVENOUS | Status: DC | PRN
  Administered 2017-06-30: 180 mg via INTRAVENOUS

## 2017-06-30 MED ORDER — BUPIVACAINE-EPINEPHRINE (PF) 0.25% -1:200000 IJ SOLN
INTRAMUSCULAR | Status: AC
  Filled 2017-06-30: qty 30

## 2017-06-30 MED ORDER — CEFAZOLIN SODIUM-DEXTROSE 2-4 GM/100ML-% IV SOLN
2.0000 g | Freq: Once | INTRAVENOUS | Status: AC
  Administered 2017-06-30: 2 g via INTRAVENOUS
  Filled 2017-06-30: qty 100

## 2017-06-30 MED ORDER — MAGNESIUM CITRATE PO SOLN: 1.0000 | Freq: Once | ORAL | Status: DC

## 2017-06-30 MED ORDER — KETOROLAC TROMETHAMINE 15 MG/ML IJ SOLN
15.0000 mg | Freq: Four times a day (QID) | INTRAMUSCULAR | Status: DC
  Administered 2017-06-30 – 2017-07-01 (×3): 15 mg via INTRAVENOUS
  Filled 2017-06-30 (×3): qty 1

## 2017-06-30 MED ORDER — ROCURONIUM BROMIDE 10 MG/ML (PF) SYRINGE
PREFILLED_SYRINGE | INTRAVENOUS | Status: AC
  Filled 2017-06-30: qty 5

## 2017-06-30 MED ORDER — LIDOCAINE 2% (20 MG/ML) 5 ML SYRINGE
INTRAMUSCULAR | Status: DC | PRN
  Administered 2017-06-30: 100 mg via INTRAVENOUS

## 2017-06-30 MED ORDER — LACTATED RINGERS IV SOLN
INTRAVENOUS | Status: DC
  Administered 2017-06-30 (×3): via INTRAVENOUS

## 2017-06-30 MED ORDER — PROPOFOL 10 MG/ML IV BOLUS
INTRAVENOUS | Status: AC
  Filled 2017-06-30: qty 20

## 2017-06-30 MED ORDER — CEFAZOLIN SODIUM-DEXTROSE 1-4 GM/50ML-% IV SOLN
1.0000 g | Freq: Three times a day (TID) | INTRAVENOUS | Status: AC
  Administered 2017-06-30 (×2): 1 g via INTRAVENOUS
  Filled 2017-06-30 (×2): qty 50

## 2017-06-30 MED ORDER — SULFAMETHOXAZOLE-TRIMETHOPRIM 800-160 MG PO TABS: 1.0000 | ORAL_TABLET | Freq: Two times a day (BID) | ORAL | 0 refills | Status: DC

## 2017-06-30 MED ORDER — HEPARIN SODIUM (PORCINE) 1000 UNIT/ML IJ SOLN
INTRAMUSCULAR | Status: AC
  Filled 2017-06-30: qty 1

## 2017-06-30 MED ORDER — PHENYLEPHRINE 40 MCG/ML (10ML) SYRINGE FOR IV PUSH (FOR BLOOD PRESSURE SUPPORT)
PREFILLED_SYRINGE | INTRAVENOUS | Status: DC | PRN
  Administered 2017-06-30: 80 ug via INTRAVENOUS

## 2017-06-30 MED ORDER — ONDANSETRON HCL 4 MG/2ML IJ SOLN: 4.0000 mg | Freq: Once | INTRAMUSCULAR | Status: DC | PRN

## 2017-06-30 MED ORDER — PHENYLEPHRINE 40 MCG/ML (10ML) SYRINGE FOR IV PUSH (FOR BLOOD PRESSURE SUPPORT)
PREFILLED_SYRINGE | INTRAVENOUS | Status: AC
  Filled 2017-06-30: qty 10

## 2017-06-30 MED ORDER — MIDAZOLAM HCL 2 MG/2ML IJ SOLN
INTRAMUSCULAR | Status: DC | PRN
  Administered 2017-06-30: 2 mg via INTRAVENOUS

## 2017-06-30 MED ORDER — HYDROCODONE-ACETAMINOPHEN 5-325 MG PO TABS: 1.0000 | ORAL_TABLET | Freq: Four times a day (QID) | ORAL | 0 refills | Status: DC | PRN

## 2017-06-30 MED ORDER — ACETAMINOPHEN 325 MG PO TABS: 650.0000 mg | ORAL_TABLET | ORAL | Status: DC | PRN

## 2017-06-30 MED ORDER — FLEET ENEMA 7-19 GM/118ML RE ENEM: 1.0000 | ENEMA | Freq: Once | RECTAL | Status: DC

## 2017-06-30 MED ORDER — KCL IN DEXTROSE-NACL 20-5-0.45 MEQ/L-%-% IV SOLN
INTRAVENOUS | Status: DC
  Administered 2017-06-30 – 2017-07-02 (×8): via INTRAVENOUS
  Filled 2017-06-30 (×8): qty 1000

## 2017-06-30 MED ORDER — SODIUM CHLORIDE 0.9 % IR SOLN
Status: DC | PRN
  Administered 2017-06-30: 1000 mL

## 2017-06-30 MED ORDER — SUGAMMADEX SODIUM 200 MG/2ML IV SOLN
INTRAVENOUS | Status: AC
  Filled 2017-06-30: qty 2

## 2017-06-30 MED ORDER — DOCUSATE SODIUM 100 MG PO CAPS
100.0000 mg | ORAL_CAPSULE | Freq: Two times a day (BID) | ORAL | Status: DC
  Administered 2017-06-30 – 2017-07-02 (×4): 100 mg via ORAL
  Filled 2017-06-30 (×4): qty 1

## 2017-06-30 MED ORDER — LIDOCAINE 2% (20 MG/ML) 5 ML SYRINGE
INTRAMUSCULAR | Status: AC
  Filled 2017-06-30: qty 5

## 2017-06-30 MED ORDER — DEXAMETHASONE SODIUM PHOSPHATE 10 MG/ML IJ SOLN
INTRAMUSCULAR | Status: AC
  Filled 2017-06-30: qty 1

## 2017-06-30 MED ORDER — SODIUM CHLORIDE 0.9 % IJ SOLN
INTRAMUSCULAR | Status: AC
  Filled 2017-06-30: qty 10

## 2017-06-30 MED ORDER — ROCURONIUM BROMIDE 10 MG/ML (PF) SYRINGE
PREFILLED_SYRINGE | INTRAVENOUS | Status: DC | PRN
  Administered 2017-06-30: 10 mg via INTRAVENOUS
  Administered 2017-06-30: 20 mg via INTRAVENOUS
  Administered 2017-06-30: 60 mg via INTRAVENOUS
  Administered 2017-06-30: 20 mg via INTRAVENOUS

## 2017-06-30 MED ORDER — HYDROMORPHONE HCL 1 MG/ML IJ SOLN: INTRAMUSCULAR | Status: DC | PRN

## 2017-06-30 MED ORDER — HYDROMORPHONE HCL 2 MG/ML IJ SOLN
INTRAMUSCULAR | Status: AC
  Filled 2017-06-30: qty 1

## 2017-06-30 MED ORDER — HYDROMORPHONE HCL 1 MG/ML IJ SOLN
INTRAMUSCULAR | Status: DC | PRN
  Administered 2017-06-30: .2 mg via INTRAVENOUS
  Administered 2017-06-30: .4 mg via INTRAVENOUS
  Administered 2017-06-30: .2 mg via INTRAVENOUS
  Administered 2017-06-30 (×3): .4 mg via INTRAVENOUS

## 2017-06-30 MED ORDER — DIPHENHYDRAMINE HCL 50 MG/ML IJ SOLN: 12.5000 mg | Freq: Four times a day (QID) | INTRAMUSCULAR | Status: DC | PRN

## 2017-06-30 MED ORDER — SUGAMMADEX SODIUM 200 MG/2ML IV SOLN
INTRAVENOUS | Status: DC | PRN
  Administered 2017-06-30: 200 mg via INTRAVENOUS

## 2017-06-30 MED ORDER — DEXAMETHASONE SODIUM PHOSPHATE 10 MG/ML IJ SOLN
INTRAMUSCULAR | Status: DC | PRN
  Administered 2017-06-30: 10 mg via INTRAVENOUS

## 2017-06-30 MED ORDER — SUFENTANIL CITRATE 50 MCG/ML IV SOLN
INTRAVENOUS | Status: AC
  Filled 2017-06-30: qty 1

## 2017-06-30 MED ORDER — BUPIVACAINE-EPINEPHRINE 0.25% -1:200000 IJ SOLN
INTRAMUSCULAR | Status: DC | PRN
Start: 2017-06-30 — End: 2017-06-30
  Administered 2017-06-30: 30 mL

## 2017-06-30 MED ORDER — SODIUM CHLORIDE 0.9 % IV BOLUS
1000.0000 mL | Freq: Once | INTRAVENOUS | Status: AC
  Administered 2017-06-30: 1000 mL via INTRAVENOUS

## 2017-06-30 MED ORDER — LACTATED RINGERS IV SOLN
INTRAVENOUS | Status: DC | PRN
  Administered 2017-06-30: 1000 mL

## 2017-06-30 MED ORDER — OXYCODONE HCL 5 MG PO TABS: 5.0000 mg | ORAL_TABLET | Freq: Once | ORAL | Status: DC | PRN

## 2017-06-30 MED ORDER — SUFENTANIL CITRATE 50 MCG/ML IV SOLN
INTRAVENOUS | Status: DC | PRN
  Administered 2017-06-30: 20 ug via INTRAVENOUS
  Administered 2017-06-30 (×3): 10 ug via INTRAVENOUS

## 2017-06-30 MED ORDER — ONDANSETRON HCL 4 MG/2ML IJ SOLN
INTRAMUSCULAR | Status: AC
  Filled 2017-06-30: qty 2

## 2017-06-30 MED ORDER — ONDANSETRON HCL 4 MG/2ML IJ SOLN
INTRAMUSCULAR | Status: DC | PRN
  Administered 2017-06-30: 4 mg via INTRAVENOUS

## 2017-06-30 MED ORDER — FENTANYL CITRATE (PF) 100 MCG/2ML IJ SOLN: 25.0000 ug | INTRAMUSCULAR | Status: DC | PRN

## 2017-06-30 MED ORDER — DIPHENHYDRAMINE HCL 12.5 MG/5ML PO ELIX: 12.5000 mg | ORAL_SOLUTION | Freq: Four times a day (QID) | ORAL | Status: DC | PRN

## 2017-06-30 MED ORDER — MIDAZOLAM HCL 2 MG/2ML IJ SOLN
INTRAMUSCULAR | Status: AC
  Filled 2017-06-30: qty 2

## 2017-06-30 MED ORDER — MORPHINE SULFATE (PF) 2 MG/ML IV SOLN
2.0000 mg | INTRAVENOUS | Status: DC | PRN
  Administered 2017-07-01: 2 mg via INTRAVENOUS
  Filled 2017-06-30: qty 1

## 2017-06-30 SURGICAL SUPPLY — 55 items
ADH SKN CLS APL DERMABOND .7 (GAUZE/BANDAGES/DRESSINGS) ×2
APPLICATOR COTTON TIP 6IN STRL (MISCELLANEOUS) ×4 IMPLANT
CATH FOLEY 2WAY SLVR 18FR 30CC (CATHETERS) ×4 IMPLANT
CATH ROBINSON RED A/P 16FR (CATHETERS) ×4 IMPLANT
CATH ROBINSON RED A/P 8FR (CATHETERS) ×4 IMPLANT
CATH TIEMANN FOLEY 18FR 5CC (CATHETERS) ×4 IMPLANT
CHLORAPREP W/TINT 26ML (MISCELLANEOUS) ×4 IMPLANT
CLIP VESOLOCK LG 6/CT PURPLE (CLIP) ×10 IMPLANT
COVER SURGICAL LIGHT HANDLE (MISCELLANEOUS) ×4 IMPLANT
COVER TIP SHEARS 8 DVNC (MISCELLANEOUS) ×2 IMPLANT
COVER TIP SHEARS 8MM DA VINCI (MISCELLANEOUS) ×2
CUTTER ECHEON FLEX ENDO 45 340 (ENDOMECHANICALS) ×4 IMPLANT
DECANTER SPIKE VIAL GLASS SM (MISCELLANEOUS) ×2 IMPLANT
DERMABOND ADVANCED (GAUZE/BANDAGES/DRESSINGS) ×2
DERMABOND ADVANCED .7 DNX12 (GAUZE/BANDAGES/DRESSINGS) ×1 IMPLANT
DRAPE ARM DVNC X/XI (DISPOSABLE) ×8 IMPLANT
DRAPE COLUMN DVNC XI (DISPOSABLE) ×2 IMPLANT
DRAPE DA VINCI XI ARM (DISPOSABLE) ×8
DRAPE DA VINCI XI COLUMN (DISPOSABLE) ×2
DRAPE SURG IRRIG POUCH 19X23 (DRAPES) ×4 IMPLANT
DRSG TEGADERM 4X4.75 (GAUZE/BANDAGES/DRESSINGS) ×4 IMPLANT
ELECT REM PT RETURN 15FT ADLT (MISCELLANEOUS) ×4 IMPLANT
GLOVE BIO SURGEON STRL SZ 6.5 (GLOVE) ×3 IMPLANT
GLOVE BIO SURGEONS STRL SZ 6.5 (GLOVE) ×1
GLOVE BIOGEL M STRL SZ7.5 (GLOVE) ×8 IMPLANT
GOWN STRL REUS W/TWL LRG LVL3 (GOWN DISPOSABLE) ×12 IMPLANT
HOLDER FOLEY CATH W/STRAP (MISCELLANEOUS) ×4 IMPLANT
IRRIG SUCT STRYKERFLOW 2 WTIP (MISCELLANEOUS) ×4
IRRIGATION SUCT STRKRFLW 2 WTP (MISCELLANEOUS) ×2 IMPLANT
IV LACTATED RINGERS 1000ML (IV SOLUTION) ×2 IMPLANT
NDL SAFETY ECLIPSE 18X1.5 (NEEDLE) ×2 IMPLANT
NEEDLE HYPO 18GX1.5 SHARP (NEEDLE) ×4
PACK ROBOT UROLOGY CUSTOM (CUSTOM PROCEDURE TRAY) ×4 IMPLANT
RELOAD STAPLE 45 4.1 GRN THCK (STAPLE) ×2 IMPLANT
SEAL CANN UNIV 5-8 DVNC XI (MISCELLANEOUS) ×8 IMPLANT
SEAL XI 5MM-8MM UNIVERSAL (MISCELLANEOUS) ×8
SOLUTION ELECTROLUBE (MISCELLANEOUS) ×4 IMPLANT
STAPLE RELOAD 45 GRN (STAPLE) ×2 IMPLANT
STAPLE RELOAD 45MM GREEN (STAPLE) ×4
SUT ETHILON 3 0 PS 1 (SUTURE) ×4 IMPLANT
SUT MNCRL 3 0 RB1 (SUTURE) ×2 IMPLANT
SUT MNCRL 3 0 VIOLET RB1 (SUTURE) ×2 IMPLANT
SUT MNCRL AB 4-0 PS2 18 (SUTURE) ×8 IMPLANT
SUT MONOCRYL 3 0 RB1 (SUTURE) ×4
SUT VIC AB 0 CT1 27 (SUTURE) ×4
SUT VIC AB 0 CT1 27XBRD ANTBC (SUTURE) ×2 IMPLANT
SUT VIC AB 0 UR5 27 (SUTURE) ×4 IMPLANT
SUT VIC AB 2-0 SH 27 (SUTURE) ×4
SUT VIC AB 2-0 SH 27X BRD (SUTURE) ×2 IMPLANT
SUT VICRYL 0 UR6 27IN ABS (SUTURE) ×5 IMPLANT
SYR 27GX1/2 1ML LL SAFETY (SYRINGE) ×4 IMPLANT
TOWEL OR 17X26 10 PK STRL BLUE (TOWEL DISPOSABLE) ×4 IMPLANT
TOWEL OR NON WOVEN STRL DISP B (DISPOSABLE) ×4 IMPLANT
TUBING INSUFFLATION 10FT LAP (TUBING) IMPLANT
WATER STERILE IRR 1000ML POUR (IV SOLUTION) ×8 IMPLANT

## 2017-06-30 NOTE — Progress Notes (Signed)
Post-op note  Subjective: The patient is doing well.  No complaints.  Denies N/V  Objective: Vital signs in last 24 hours: Temp:  [97.7 F (36.5 C)-98.8 F (37.1 C)] 97.7 F (36.5 C) (06/06 1145) Pulse Rate:  [84-99] 95 (06/06 1145) Resp:  [11-16] 13 (06/06 1145) BP: (130-140)/(75-83) 140/81 (06/06 1145) SpO2:  [94 %-100 %] 97 % (06/06 1145) Weight:  [92.1 kg (203 lb)] 92.1 kg (203 lb) (06/06 0545)  Intake/Output from previous day: No intake/output data recorded. Intake/Output this shift: Total I/O In: 2100 [I.V.:2000; IV Piggyback:100] Out: 530 [Urine:60; Drains:70; Blood:400]  Physical Exam:  General: Alert and oriented. Abdomen: Soft, Nondistended. Incisions: Clean and dry. Urine: red  Lab Results: Recent Labs    06/30/17 1116  HGB 12.0*  HCT 34.6*    Assessment/Plan: POD#0   1) Continue to monitor  2) DVT prophy, clears, IS, amb, pain control   LOS: 0 days   Debbrah Alar 06/30/2017, 11:57 AM

## 2017-06-30 NOTE — Interval H&P Note (Signed)
History and Physical Interval Note:  06/30/2017 6:55 AM  Lawrence Cummings.  has presented today for surgery, with the diagnosis of PROSTATE CANCER  The various methods of treatment have been discussed with the patient and family. After consideration of risks, benefits and other options for treatment, the patient has consented to  Procedure(s): XI ROBOTIC ASSISTED LAPAROSCOPIC RADICAL PROSTATECTOMY LEVEL 2 (N/A) LYMPHADENECTOMY, PELVIC (Bilateral) as a surgical intervention .  The patient's history has been reviewed, patient examined, no change in status, stable for surgery.  I have reviewed the patient's chart and labs.  Questions were answered to the patient's satisfaction.     Zohar Maroney,LES

## 2017-06-30 NOTE — Transfer of Care (Signed)
Immediate Anesthesia Transfer of Care Note  Patient: Lawrence Cummings.  Procedure(s) Performed: XI ROBOTIC ASSISTED LAPAROSCOPIC RADICAL PROSTATECTOMY LEVEL 2 (N/A ) LYMPHADENECTOMY, PELVIC (Bilateral )  Patient Location: PACU  Anesthesia Type:General  Level of Consciousness: awake  Airway & Oxygen Therapy: Patient Spontanous Breathing and Patient connected to face mask oxygen  Post-op Assessment: Report given to RN and Post -op Vital signs reviewed and stable  Post vital signs: Reviewed and stable  Last Vitals:  Vitals Value Taken Time  BP 138/76 06/30/2017 11:04 AM  Temp    Pulse    Resp 14 06/30/2017 11:05 AM  SpO2    Vitals shown include unvalidated device data.  Last Pain:  Vitals:   06/30/17 0607  TempSrc: Oral  PainSc:          Complications: No apparent anesthesia complications

## 2017-06-30 NOTE — Interval H&P Note (Signed)
History and Physical Interval Note:  06/30/2017 6:55 AM  Lawrence Cummings.  has presented today for surgery, with the diagnosis of PROSTATE CANCER  The various methods of treatment have been discussed with the patient and family. After consideration of risks, benefits and other options for treatment, the patient has consented to  Procedure(s): XI ROBOTIC ASSISTED LAPAROSCOPIC RADICAL PROSTATECTOMY LEVEL 2 (N/A) LYMPHADENECTOMY, PELVIC (Bilateral) as a surgical intervention .  The patient's history has been reviewed, patient examined, no change in status, stable for surgery.  I have reviewed the patient's chart and labs.  Questions were answered to the patient's satisfaction.     [name],LES

## 2017-06-30 NOTE — Op Note (Signed)
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 Robotic assisted laparoscopic radical prostatectomy (Unilateral left nerve sparing) 2. Bilateral robotic assisted laparoscopic extended pelvic lymphadenectomy  Surgeon: Pryor Curia. M.D.  Assistant(s):Amanda Dancy, PA-C  An assistant was required for this surgical procedure.  The duties of the assistant included but were not limited to suctioning, passing suture, camera manipulation, retraction. This procedure would not be able to be performed without an Environmental consultant.  Resident: Dr. Fredrik Rigger  Anesthesia: General  Complications: None  EBL: 400 mL  IVF:  2000 mL crystalloid  Specimens: 1. Prostate and seminal vesicles 2. Right external iliac lymph nodes 3. Left external iliac lymph nodes 4. Right obturator and hypogastric lymph nodes 5. Left obturator and hypogastric lymph nodes  Disposition of specimens: Pathology  Drains: 1. 20 Fr coude catheter 2. # 19 Blake pelvic drain  Indication: Lawrence Cummings. is a 62 y.o. patient with clinically localized, high risk prostate cancer.  After a thorough review of the management options for treatment of prostate cancer, he elected to proceed with surgical therapy and the above procedure(s).  We have discussed the potential benefits and risks of the procedure, side effects of the proposed treatment, the likelihood of the patient achieving the goals of the procedure, and any potential problems that might occur during the procedure or recuperation. Informed consent has been obtained.  Description of procedure:  The patient was taken to the operating room and a general anesthetic was administered. He was given preoperative antibiotics, placed in the dorsal lithotomy position, and prepped and draped in  the usual sterile fashion. Next a preoperative timeout was performed. A urethral catheter was placed into the bladder and a site was selected near the umbilicus for placement of the camera port. This was placed using a standard open Hassan technique which allowed entry into the peritoneal cavity under direct vision and without difficulty. An 8 mm port was placed and a pneumoperitoneum established. The camera was then used to inspect the abdomen and there was no evidence of any intra-abdominal injuries or other abnormalities. The remaining abdominal ports were then placed. 8 mm robotic ports were placed in the right lower quadrant, left lower quadrant, and far left lateral abdominal wall. A 5 mm port was placed in the right upper quadrant and a 12 mm port was placed in the right lateral abdominal wall for laparoscopic assistance. All ports were placed under direct vision without difficulty. The surgical cart was then docked.   Utilizing the cautery scissors, the bladder was reflected posteriorly allowing entry into the space of Retzius and identification of the endopelvic fascia and prostate. The periprostatic fat was then removed from the prostate allowing full exposure of the endopelvic fascia. The endopelvic fascia was then incised from the apex back to the base of the prostate bilaterally and the underlying levator muscle fibers were swept laterally off the prostate thereby isolating the dorsal venous complex. The dorsal vein was then stapled and divided with a 45 mm Flex Echelon stapler. Attention then turned to the bladder neck which was divided anteriorly thereby allowing entry into the bladder and exposure of the urethral catheter. The catheter balloon was deflated and the catheter was brought into the operative field and used to retract the prostate anteriorly. The posterior bladder neck was then examined and was divided allowing further dissection between the  bladder and prostate posteriorly until the  vasa deferentia and seminal vessels were identified. The vasa deferentia were isolated, divided, and lifted anteriorly. The seminal vesicles were dissected down to their tips with care to control the seminal vascular arterial blood supply. These structures were then lifted anteriorly and the space between Denonvillier's fascia and the anterior rectum was developed with a combination of sharp and blunt dissection. This isolated the vascular pedicles of the prostate.  The lateral prostatic fascia on the left side of the prostate was then sharply incised allowing release of the neurovascular bundle. The vascular pedicle of the prostate on the left side was then ligated with Weck clips between the prostate and neurovascular bundle and divided with sharp cold scissor dissection resulting in neurovascular bundle preservation. On the right side, a wide non nerve sparing dissection was performed with Weck clips used to ligate the vascular pedicle of the prostate. The neurovascular bundle on the left side was then separated off the apex of the prostate and urethra.  The urethra was then sharply transected allowing the prostate specimen to be disarticulated. The pelvis was copiously irrigated and hemostasis was ensured. There was no evidence for rectal injury.  Attention then turned to the right pelvic sidewall. The fibrofatty tissue extending from the genitofemoral nerve laterally to the confluence of the iliac vessels proximally to the hypogastric artery posteriorly and Cooper's ligament distally was dissected free from the pelvic sidewall with care to preserve the obturator nerve and major vascular structures. Weck clips were used for lymphostasis and hemostasis. An identical procedure was then performed on the contralateral side and the lymphatic packets were removed for permanent pathologic analysis.  Attention then turned to the urethral anastomosis. A 2-0 Vicryl slip knot was placed between Denonvillier's  fascia, the posterior bladder neck, and the posterior urethra to reapproximate these structures. A double-armed 3-0 Monocryl suture was then used to perform a 360 running tension-free anastomosis between the bladder neck and urethra. A new urethral catheter was then placed into the bladder and irrigated. There were no blood clots within the bladder and the anastomosis appeared to be watertight. A #19 Blake drain was then brought through the left lateral 8 mm port site and positioned appropriately within the pelvis. It was secured to the skin with a nylon suture. The surgical cart was then undocked. The right lateral 12 mm port site was closed at the fascial level with a 0 Vicryl suture placed laparoscopically. All remaining ports were then removed under direct vision. The prostate specimen was removed intact within the Endopouch retrieval bag via the periumbilical camera port site. This fascial opening was closed with two running 0 Vicryl sutures. 0.25% Marcaine was then injected into all port sites and all incisions were reapproximated at the skin level with 4-0 Monocryl subcuticular sutures. Dermabond was applied. The patient appeared to tolerate the procedure well and without complications. The patient was able to be extubated and transferred to the recovery unit in satisfactory condition.  Pryor Curia MD

## 2017-06-30 NOTE — Anesthesia Postprocedure Evaluation (Signed)
Anesthesia Post Note  Patient: Lawrence Cummings.  Procedure(s) Performed: XI ROBOTIC ASSISTED LAPAROSCOPIC RADICAL PROSTATECTOMY LEVEL 2 (N/A ) LYMPHADENECTOMY, PELVIC (Bilateral )     Patient location during evaluation: PACU Anesthesia Type: General Level of consciousness: awake and alert Pain management: pain level controlled Vital Signs Assessment: post-procedure vital signs reviewed and stable Respiratory status: spontaneous breathing, nonlabored ventilation and respiratory function stable Cardiovascular status: blood pressure returned to baseline and stable Postop Assessment: no apparent nausea or vomiting Anesthetic complications: no    Last Vitals:  Vitals:   06/30/17 1145 06/30/17 1204  BP: 140/81 137/89  Pulse: 95 93  Resp: 13 14  Temp: 36.5 C (!) 36.4 C  SpO2: 97% 100%    Last Pain:  Vitals:   06/30/17 1204  TempSrc: Oral  PainSc: 0-No pain                 Audry Pili

## 2017-06-30 NOTE — Discharge Instructions (Signed)

## 2017-06-30 NOTE — Anesthesia Procedure Notes (Signed)
Procedure Name: Intubation Date/Time: 06/30/2017 7:28 AM Performed by: Sharlette Dense, CRNA Patient Re-evaluated:Patient Re-evaluated prior to induction Oxygen Delivery Method: Circle system utilized Preoxygenation: Pre-oxygenation with 100% oxygen Induction Type: IV induction Ventilation: Mask ventilation without difficulty and Oral airway inserted - appropriate to patient size Laryngoscope Size: Miller and 3 Grade View: Grade I Tube type: Oral Tube size: 8.0 mm Number of attempts: 1 Airway Equipment and Method: Stylet Placement Confirmation: positive ETCO2,  ETT inserted through vocal cords under direct vision and breath sounds checked- equal and bilateral Secured at: 22 cm Tube secured with: Tape Dental Injury: Teeth and Oropharynx as per pre-operative assessment

## 2017-07-01 ENCOUNTER — Encounter (HOSPITAL_COMMUNITY): Payer: Self-pay | Admitting: Urology

## 2017-07-01 ENCOUNTER — Encounter: Payer: Self-pay | Admitting: Medical Oncology

## 2017-07-01 DIAGNOSIS — C61 Malignant neoplasm of prostate: Secondary | ICD-10-CM | POA: Diagnosis not present

## 2017-07-01 LAB — HEMOGLOBIN AND HEMATOCRIT, BLOOD
HCT: 28 % — ABNORMAL LOW (ref 39.0–52.0)
HCT: 25.5 % — ABNORMAL LOW (ref 39.0–52.0)
HEMATOCRIT: 26.7 % — AB (ref 39.0–52.0)
HEMOGLOBIN: 9.7 g/dL — AB (ref 13.0–17.0)
HEMOGLOBIN: 9.3 g/dL — AB (ref 13.0–17.0)
HEMOGLOBIN: 9.1 g/dL — AB (ref 13.0–17.0)

## 2017-07-01 LAB — CREATININE, FLUID (PLEURAL, PERITONEAL, JP DRAINAGE): CREAT FL: 1.1 mg/dL

## 2017-07-01 LAB — GLUCOSE, CAPILLARY: Glucose-Capillary: 236 mg/dL — ABNORMAL HIGH (ref 65–99)

## 2017-07-01 MED ORDER — SODIUM CHLORIDE 0.9 % IV SOLN
Freq: Once | INTRAVENOUS | Status: AC
  Administered 2017-07-01: 13:00:00 via INTRAVENOUS

## 2017-07-01 MED ORDER — BISACODYL 10 MG RE SUPP
10.0000 mg | Freq: Once | RECTAL | Status: AC
  Administered 2017-07-01: 10 mg via RECTAL
  Filled 2017-07-01: qty 1

## 2017-07-01 MED ORDER — HYDROCODONE-ACETAMINOPHEN 5-325 MG PO TABS
1.0000 | ORAL_TABLET | Freq: Four times a day (QID) | ORAL | Status: DC | PRN
  Administered 2017-07-01 – 2017-07-02 (×3): 1 via ORAL
  Administered 2017-07-02: 2 via ORAL
  Filled 2017-07-01: qty 2
  Filled 2017-07-01 (×3): qty 1

## 2017-07-01 NOTE — Progress Notes (Signed)
Patient ID: Lawrence Cummings., male   DOB: 1955/03/20, 62 y.o.   MRN: [id number] 1 Day Post-Op  Subjective:  Pt still orthostatic today.  Has not been able to ambulate. Otherwise doing well.  He has had a bowel movement.  Hgb has been stable.  Still with drainage around drain site.  Objective: Vital signs in last 24 hours: Temp:  [98.3 F (36.8 C)-99.2 F (37.3 C)] 98.3 F (36.8 C) (06/07 1445) Pulse Rate:  [70-122] 80 (06/07 1445) Resp:  [14-18] 16 (06/07 1445) BP: (84-131)/(51-104) 107/67 (06/07 1445) SpO2:  [98 %-100 %] 100 % (06/07 1445)  Intake/Output from previous day: 06/06 0701 - 06/07 0700 In: 4010 [I.V.:2860; IV Piggyback:1150] Out: 2460 [Urine:1510; Drains:550; Blood:400] Intake/Output this shift: Total I/O In: 2240 [P.O.:840; I.V.:1400] Out: 910 [Urine:900; Drains:10]  Physical Exam:  General: Alert and oriented CV: RRR Lungs: Clear Abdomen: Soft, ND, serosanguinous drainage around drain Incisions: C/D/I Ext: NT, No erythema  Lab Results: Recent Labs    07/01/17 0446 07/01/17 1122 07/01/17 1437  HGB 9.7* 9.3* 9.1*  HCT 28.0* 26.7* 25.5*    Studies/Results: No results found.  Assessment/Plan: - Will check drain Cr but then plan to d/c drain to minimize drainage from site (do not suspect ongoing bleeding nor urine leak) - Advance diet - Continue attempts to ambulate and monitor for orthostasis - Will decreased IVF if able to start ambulating - Hopefully will be able to go home in AM.  Will recheck Hgb in the morning.    LOS: 0 days   Elandra Powell,LES 07/01/2017, 5:30 PM

## 2017-07-01 NOTE — Progress Notes (Signed)
Pt had positive orthostatic vital signs this am.  No complaints of dizziness or diaphoresis.  Dr. Alinda Money notified, will continue to monitor.

## 2017-07-01 NOTE — Progress Notes (Signed)
Patient  Stood to ambulate to restroom, while changing from lying, sitting to standing position, patient c/o feeling tired and weak, patient sat back down on bed and patient became diaphoretic, limp, and unresponsive. Patients airway patient, and pulses remained positive. Patient laid back in bed, and regained consciousness and was alert and oriented. Patient checked orthostatic VS which showed a drop in blood pressure with position change. No other complaints at this time, and no needs voiced. Family remained at bedside. Will continue to monitor patient

## 2017-07-01 NOTE — Progress Notes (Signed)
Visited Lawrence Cummings and his family at [facility]. He is post op robotic prostatectomy. He states the surgery went well but has has issues with his BP. He reports a syncopal episode during the night when he tried to walk and this morning he felt slight dizziness when he went to the restroom. We discussed sitting, standing and then walking to allow  BP to regulate. He is hoping for discharge later today. We discussed the importance of walking and deep breathing after discharge. I asked him to call me if I can answer any questions or assist him in any way.

## 2017-07-01 NOTE — Progress Notes (Signed)
Positive orthostatic vital signs.  Pt stood approx 15 seconds before complaining of dizziness, became diaphoretic and did not respond at first to commands.  When he sat down, he immediately answered appropriately.  BP returned to starting value.

## 2017-07-01 NOTE — Progress Notes (Signed)
Patient ID: Lawrence Cummings., male   DOB: 1955-09-18, 62 y.o.   MRN: [id number]  1 Day Post-Op Subjective: Pt with reported syncopal episode around midnight when he tried to ambulate and became orthostatic.  He has remained stable since then.  He has not ambulated again.  Per nursing, he has required reinforcement dressings to his drain site with bloody drainage noted. No nausea or vomiting. Pain is adequately controlled.  Objective: Vital signs in last 24 hours: Temp:  [97.5 F (36.4 C)-99.2 F (37.3 C)] 99.2 F (37.3 C) (06/07 0626) Pulse Rate:  [81-115] 81 (06/07 0626) Resp:  [11-18] 18 (06/07 0626) BP: (85-140)/(54-89) 116/84 (06/07 0626) SpO2:  [96 %-100 %] 98 % (06/07 0626)  Intake/Output from previous day: 06/06 0701 - 06/07 0700 In: 4010 [I.V.:2860; IV Piggyback:1150] Out: 2460 [Urine:1510; Drains:550; Blood:400] Intake/Output this shift: No intake/output data recorded.  Physical Exam:  General: Alert and oriented. CV: RRR Lungs: Clear bilaterally. GI: Soft, Nondistended. Positive BS. Incisions: Clean, dry, and intact, drain site with drainage around drain now, drain with minimal bloody drainage (last emptied 90 minutes ago) Urine: Clear Extremities: Nontender, no erythema, no edema.  Lab Results: Recent Labs    06/30/17 1116 07/01/17 0446  HGB 12.0* 9.7*  HCT 34.6* 28.0*      Assessment/Plan: POD# 1 s/p robotic prostatectomy.  1) Recheck Hgb later this morning 2) Will check orthostatics prior to ambulation this morning and continue IVF, Incentive spirometry 3) Transition to oral pain medication 4) Dulcolax suppository 5) Will reassess later today for possible discharge later this afternoon if stable    Pryor Curia. MD   LOS: 0 days   Lise Pincus,LES 07/01/2017, 7:44 AM

## 2017-07-02 DIAGNOSIS — C61 Malignant neoplasm of prostate: Secondary | ICD-10-CM | POA: Diagnosis not present

## 2017-07-02 LAB — HEMOGLOBIN AND HEMATOCRIT, BLOOD
HEMATOCRIT: 23.1 % — AB (ref 39.0–52.0)
HEMATOCRIT: 23.8 % — AB (ref 39.0–52.0)
HEMATOCRIT: 24.9 % — AB (ref 39.0–52.0)
HEMOGLOBIN: 7.9 g/dL — AB (ref 13.0–17.0)
HEMOGLOBIN: 8.2 g/dL — AB (ref 13.0–17.0)
Hemoglobin: 8.6 g/dL — ABNORMAL LOW (ref 13.0–17.0)

## 2017-07-02 NOTE — Progress Notes (Signed)
Pt ambulated without difficulty, denies instability or dizziness.

## 2017-07-02 NOTE — Progress Notes (Signed)
Foley/leg bag teaching complete with and wife. Pt and wife able to demonstrate foley and leg bag care. Reviewed entire discharge home care with patient and wife, both acknowledge understanding and return demonstration noted by pt and wife. Pt mother also present at bedside during teaching. SRP, RN

## 2017-07-02 NOTE — Progress Notes (Signed)
Small amount drainage noted at JP site, change guaze and notice small suture in place, called MD to informed and asked if it needed to be removed, suture removed. SRP, RN

## 2017-07-02 NOTE — Discharge Summary (Signed)
Date of admission: 06/30/2017  Date of discharge: 07/02/2017  Admission diagnosis: Prostate Cancer  Discharge diagnosis: Prostate Cancer  History and Physical: For full details, please see admission history and physical. Briefly, Lawrence Bonn. is a 62 y.o. gentleman with localized prostate cancer.  After discussing management/treatment options, he elected to proceed with surgical treatment.  Hospital Course: Lawrence Martelle. was taken to the operating room on 06/30/2017 and underwent a robotic assisted laparoscopic radical prostatectomy. He tolerated this procedure well and without complications. Postoperatively, he was able to be transferred to a regular hospital room following recovery from anesthesia.  He was able to begin ambulating the night of surgery but became orthostatic. He remained hemodynamically stable overnight but his Hgb had significantly dropped on the morning of POD#1.  Serial Hgb measurements were performed and stabilized over the next 24 hrs with a final Hgb of 8.6.  He remained somewhat orthostatic throughout POD # 1 and therefore remained hospitalized and received additional IVF hydration.  He had excellent urine output with appropriately minimal output from his pelvic drain and his pelvic drain was removed on POD #1 after it was confirmed to be consistent with serum.  He was transitioned to oral pain medication, tolerated a clear liquid diet, and was able to ambulate well on POD #2. By that afternoon, he had met all discharge criteria and was able to be discharged home later on POD#2.  Laboratory values:  Recent Labs    07/02/17 0429 07/02/17 0924 07/02/17 1530  HGB 7.9* 8.2* 8.6*  HCT 23.1* 23.8* 24.9*    Disposition: Home  Discharge instruction: He was instructed to be ambulatory but to refrain from heavy lifting, strenuous activity, or driving. He was instructed on urethral catheter care.  Discharge medications:   Allergies as of 07/02/2017   No Known  Allergies     Medication List    TAKE these medications   HYDROcodone-acetaminophen 5-325 MG tablet Commonly known as:  NORCO Take 1-2 tablets by mouth every 6 (six) hours as needed for moderate pain or severe pain.   sulfamethoxazole-trimethoprim 800-160 MG tablet Commonly known as:  BACTRIM DS,SEPTRA DS Take 1 tablet by mouth 2 (two) times daily. Start the day prior to foley removal appointment       Followup: He will followup in 1 week for catheter removal and to discuss his surgical pathology results.

## 2017-07-02 NOTE — Progress Notes (Signed)
D/C'd home with wife, instructions reviewed , pt and wife comfortable, acknowledged and demonstrated care of foley and home plans. Pt stable, ambulated several times in hall and tolerating POs without issues. Steri strips placed over JP site and one of the lap sites, care instructions reviewed with pt and wife. SRP, RN

## 2017-07-02 NOTE — Progress Notes (Addendum)
2 Days Post-Op Subjective: Orthostasis has improved over the last shift.  He is ambulating without difficulty and denies instability or dizziness.  He is tolerating a regular diet and passing flatus.  H&H dropped slightly this morning, but repeat labs at 0930 showed that his hemoglobin had improved slightly to 8.2.  Vital signs have remained stable.  He was on 150 mL/h of IV fluids postop, which could have had a dilutional effect on his H/H.  Urine output over the last shift was 3.6 L.  Clinically, he appears to be doing very well. Objective: Vital signs in last 24 hours: Temp:  [98.1 F (36.7 C)-99.2 F (37.3 C)] 98.7 F (37.1 C) (06/08 0701) Pulse Rate:  [70-122] 82 (06/08 0701) Resp:  [16-18] 18 (06/08 0701) BP: (84-128)/(51-81) 100/66 (06/08 0701) SpO2:  [95 %-100 %] 98 % (06/08 0701)  Intake/Output from previous day: 06/07 0701 - 06/08 0700 In: 3102.5 [P.O.:1140; I.V.:1962.5] Out: 5409 [Urine:3600; Drains:30]  Intake/Output this shift: Total I/O In: -  Out: 300 [Urine:300]  Physical Exam:  General: Alert and oriented CV: RRR, palpable distal pulses Lungs: CTAB, equal chest rise Abdomen: Soft, NTND, no rebound or guarding Incisions: Clean, dry and intact Gu: Foley in place and draining clear-yellow urine Ext: NT, No erythema  Lab Results: Recent Labs    07/01/17 1437 07/02/17 0429 07/02/17 0924  HGB 9.1* 7.9* 8.2*  HCT 25.5* 23.1* 23.8*   BMET No results for input(s): NA, K, CL, CO2, GLUCOSE, BUN, CREATININE, CALCIUM in the last 72 hours.   Studies/Results: No results found.  Assessment/Plan: Postop day 2 status post robotic assisted laparoscopic prostatectomy  Postop anemia Orthostatic hypotension-stable  -We will continue to check serial H/Hs and closely monitor his vital signs.  I suspect that part of his anemia is delusional from IV fluids.   -Out of bed to chair and ambulate -Advance diet -We will continue to monitor   LOS: 0 days   Ellison Hughs, MD [facility] Pager: [phone]  07/02/2017, 11:10 AM

## 2019-04-05 ENCOUNTER — Ambulatory Visit: Payer: BC Managed Care – PPO | Attending: Family

## 2019-04-05 DIAGNOSIS — Z23 Encounter for immunization: Secondary | ICD-10-CM

## 2019-04-05 NOTE — Progress Notes (Signed)
   [id number]  Name:  Lawrence Cummings.    MRN: [id number] DOB: 06-04-1955  04/05/2019  [name] was observed post Covid-19 immunization for 15 minutes without incident. He was provided with Vaccine Information Sheet and instruction to access the V-Safe system.   [name] was instructed to call 911 with any severe reactions post vaccine: [name] Kitchen Difficulty breathing  . Swelling of face and throat  . A fast heartbeat  . A bad rash all over body  . Dizziness and weakness   Immunizations Administered    Name Date Dose VIS Date Route   Moderna COVID-19 Vaccine 04/05/2019 11:34 AM 0.5 mL 12/26/2018 Intramuscular   Manufacturer: Moderna   Lot: [id number]   [id number]

## 2019-05-08 ENCOUNTER — Ambulatory Visit: Payer: BC Managed Care – PPO | Attending: Internal Medicine

## 2019-05-08 DIAGNOSIS — Z23 Encounter for immunization: Secondary | ICD-10-CM

## 2019-05-08 NOTE — Progress Notes (Signed)
   [id number] [facility]  Name:  Lawrence Cummings.    MRN: [id number] DOB: Dec 27, 1955  05/08/2019  [name] was observed post Covid-19 immunization for 15 minutes without incident. He was provided with Vaccine Information Sheet and instruction to access the V-Safe system.   [name] was instructed to call 911 with any severe reactions post vaccine: [name] Kitchen Difficulty breathing  . Swelling of face and throat  . A fast heartbeat  . A bad rash all over body  . Dizziness and weakness   Immunizations Administered    Name Date Dose VIS Date Route   Moderna COVID-19 Vaccine 05/08/2019  2:24 PM 0.5 mL 12/26/2018 Intramuscular   Manufacturer: Moderna   Lot: [id number]   [id number]

## 2019-10-24 IMAGING — CT NM PET NOPR SKULL BASE TO THIGH
7 series · 24 of 25 positions shown · non-contrast
Comparison: None.

CLINICAL DATA: High-grade prostate carcinoma, Gleason score 9.

EXAM:
NUCLEAR MEDICINE PET SKULL BASE TO THIGH
TECHNIQUE: 10.9 mCi F-18 Fluciclovine was injected intravenously. Full-ring PET
imaging was performed from the skull base to thigh after the
radiotracer. CT data was obtained and used for attenuation
correction and anatomic localization.

[Series 4: ct sk_thigh 5.0 b31f · axial · 5.0mm · 0.98mm/px · z∈[+384,+1332]mm · 6 of 238 slices shown]
[im 1/238]
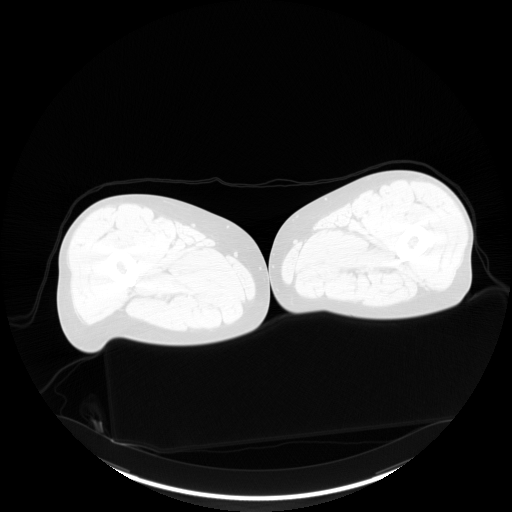
[im 48/238  brain]
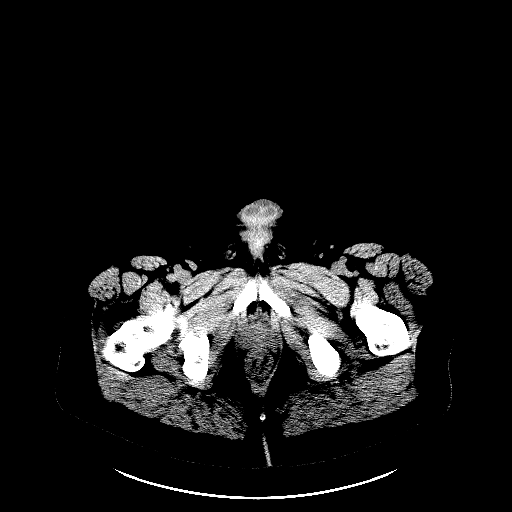
[im 95/238]
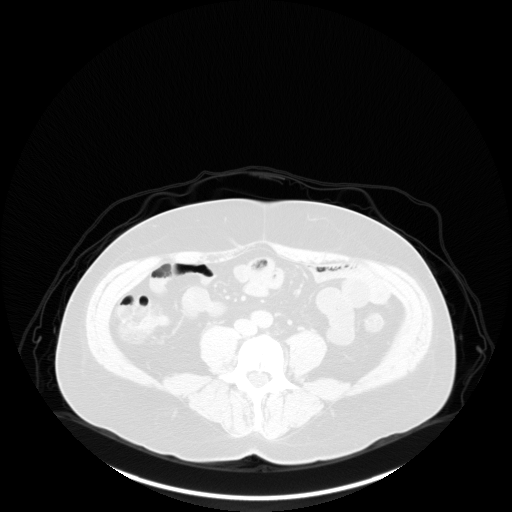
[im 143/238]
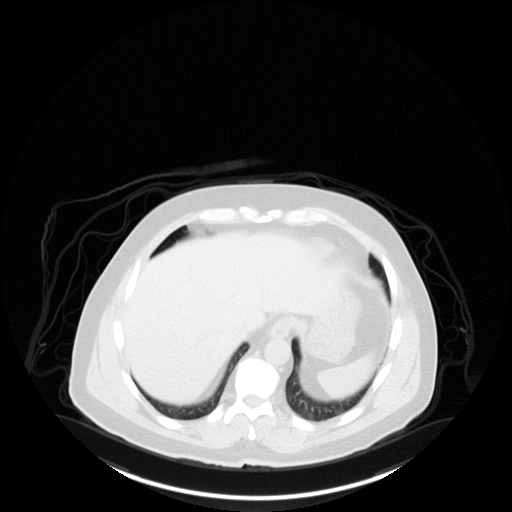
[im 190/238]
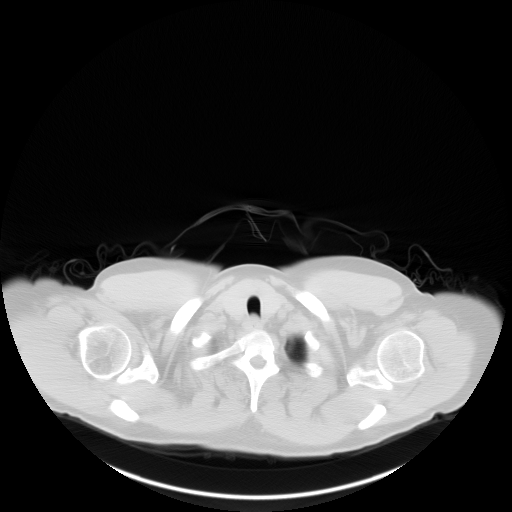
[im 238/238  brain]
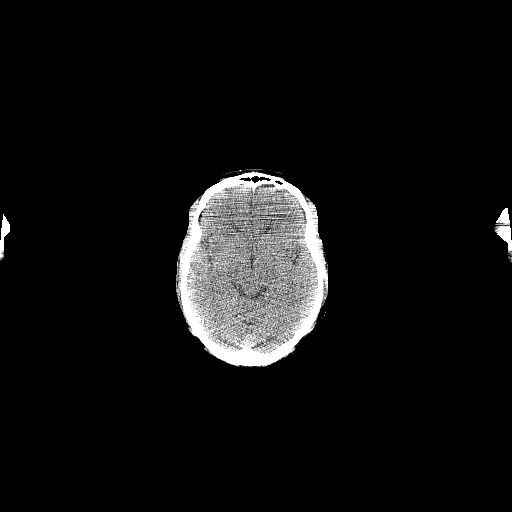

[Series 5: pet sk_thigh nac · axial · 5.0mm · 4.07mm/px · z∈[+384,+1332]mm · 6 of 238 slices shown]
[im 1/238]
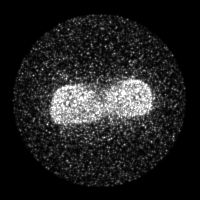
[im 48/238]
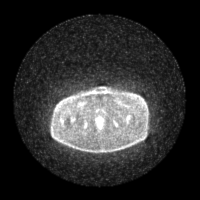
[im 95/238]
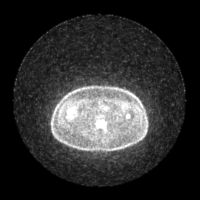
[im 143/238]
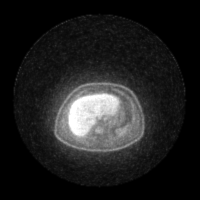
[im 190/238]
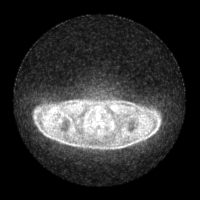
[im 238/238]
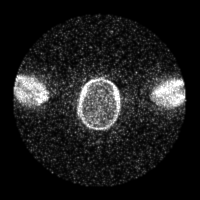

[Series 8: ct sk_thigh 5.0 b70f (id)_bone · axial · 5.0mm · 0.66mm/px · 1 of 66 slices shown]
[im 66/66  bone]
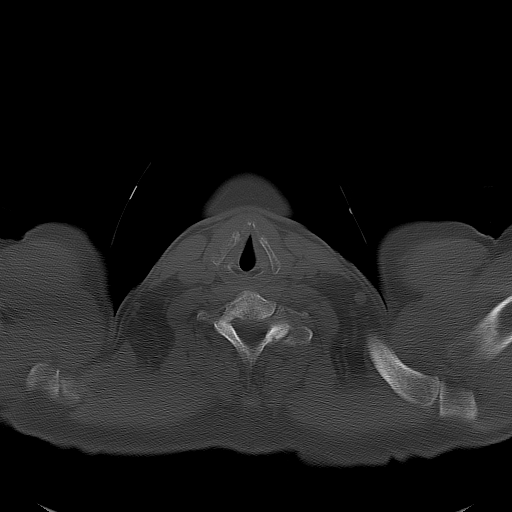

[Series 604: range-ct sk_thigh 5.0 (id)<alpha range(1)> · 2 of 56 slices shown]
[im 1/56]
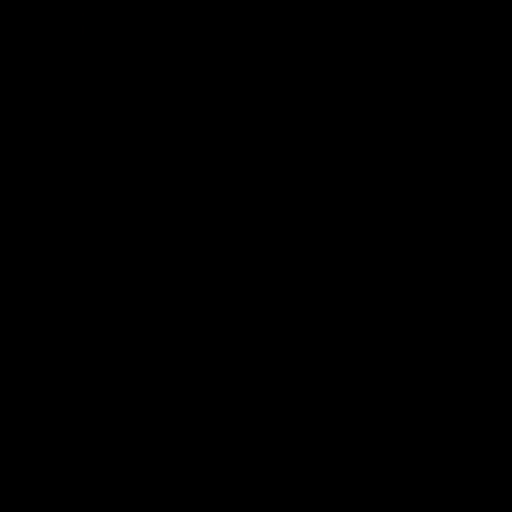
[im 56/56]
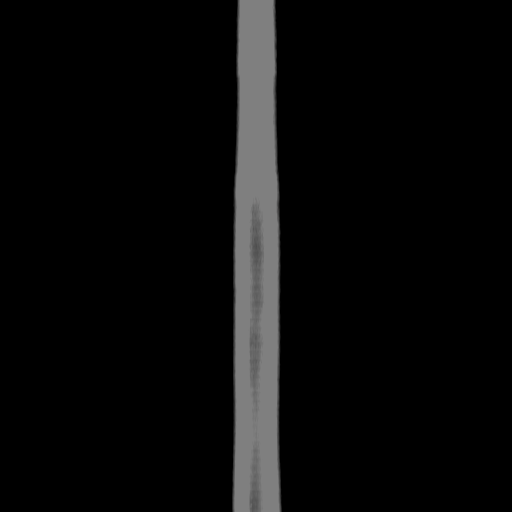

[Series 605: mip range 2 · coronal · 1.97mm/px · 1 of 32 slices shown]
[im 1/32]
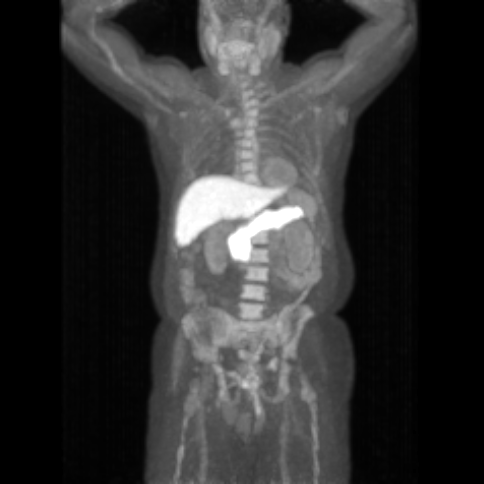

[Series 606: range-ct sk_thigh 5.0 (id)<alpha range> · 7 of 231 slices shown]
[im 1/231]
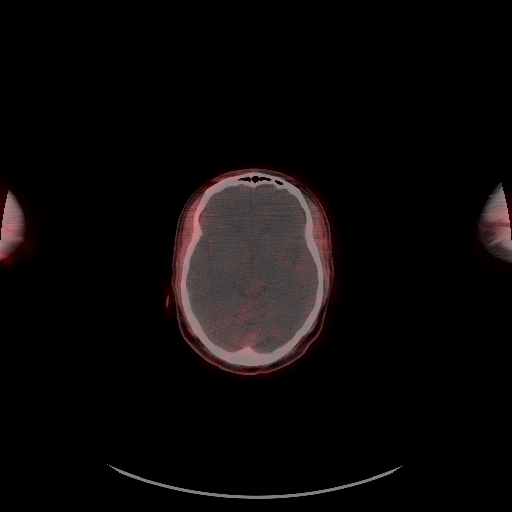
[im 39/231]
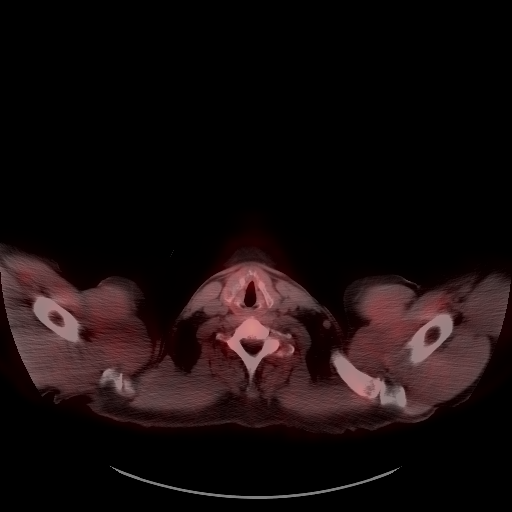
[im 77/231]
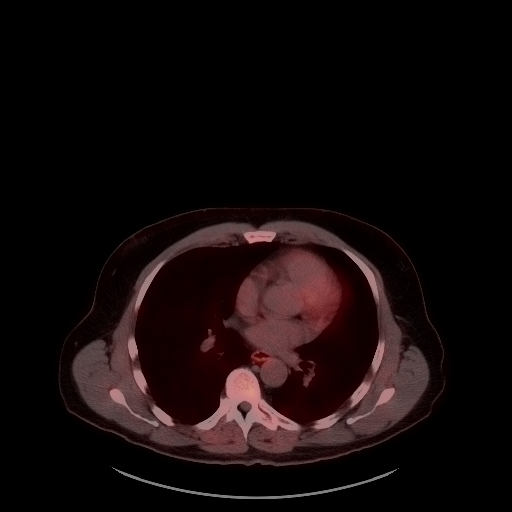
[im 116/231]
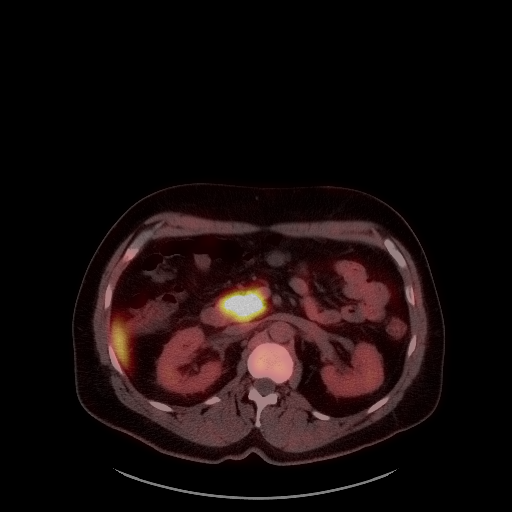
[im 154/231]
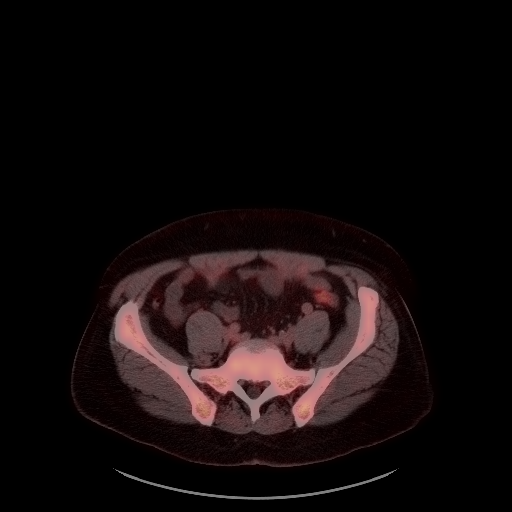
[im 192/231]
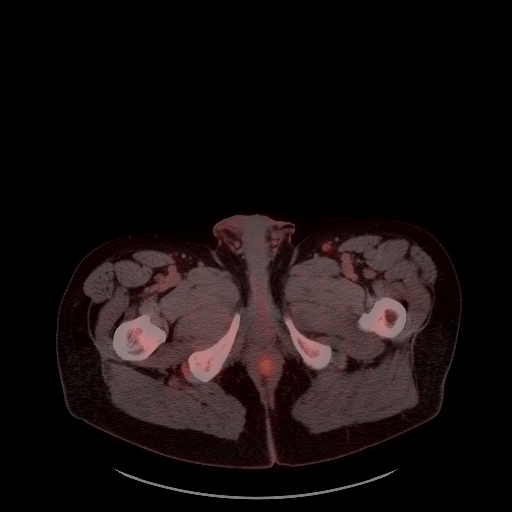
[im 231/231]
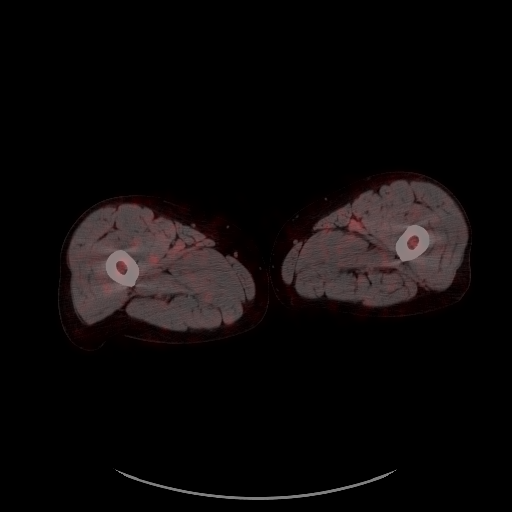

[Series 1068: results mm oncology reading · 1.0mm · 0.79mm/px · 1 of 1 slices shown]
[im 1/1]
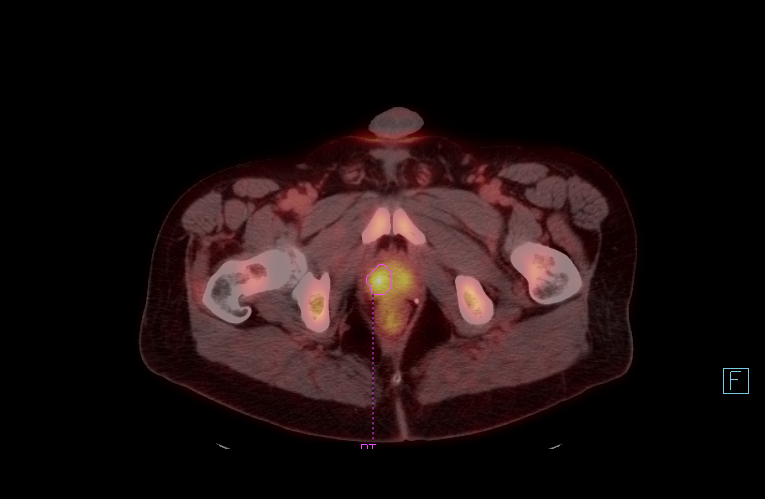

[24 of 25 positions shown; findings below may reference images not displayed]

FINDINGS: NECK

No radiotracer activity in neck lymph nodes.

Incidental CT finding: None

CHEST

No radiotracer accumulation within mediastinal or hilar lymph nodes.
No suspicious pulmonary nodules on the CT scan.

Incidental CT finding: None

ABDOMEN/PELVIS

Prostate: Radiotracer uptake is seen in the prostate gland, right
side greater than left. SUV max in right prostate gland measures
7.5.

Lymph nodes: No abnormal radiotracer accumulation within pelvic or
abdominal nodes.

Liver: No evidence of liver metastasis

Incidental CT finding: None

SKELETON

No focal  activity to suggest skeletal metastasis.
IMPRESSION: Asymmetric prostate radiotracer uptake, right side greater than
left.

No evidence of lymph node or distant metastatic disease.

## 2021-09-18 ENCOUNTER — Telehealth: Payer: Self-pay | Admitting: Radiation Oncology

## 2021-09-18 NOTE — Telephone Encounter (Signed)
LVM to sched CON with Dr. Manning 

## 2021-09-24 NOTE — Progress Notes (Addendum)
GU Location of Tumor / Histology: Prostate Ca  If Prostate Cancer, Gleason Score is (4 + 5) and PSA is (7.97 on 06/2017 pretreatment and 0.169 on 09/10/2021)    05/10/2017 Dr. Junious Silk NM PET (Axumin) Skull Base To Mid Thigh CLINICAL DATA:  High-grade prostate carcinoma, Gleason score 9.  FINDINGS: NECK No radiotracer activity in neck lymph nodes.   Incidental CT finding: None   CHEST No radiotracer accumulation within mediastinal or hilar lymph nodes.  No suspicious pulmonary nodules on the CT scan.   Incidental CT finding: None   ABDOMEN/PELVIS Prostate: Radiotracer uptake is seen in the prostate gland, right side greater than left. SUV max in right prostate gland measures 7.5.   Lymph nodes: No abnormal radiotracer accumulation within pelvic or abdominal nodes.   Liver: No evidence of liver metastasis   Incidental CT finding: None   SKELETON No focal  activity to suggest skeletal metastasis.   IMPRESSION: Asymmetric prostate radiotracer uptake, right side greater than left.  No evidence of lymph node or distant metastatic disease.    Past/Anticipated interventions by urology, if any:     Past/Anticipated interventions by medical oncology, if any:  NA  Weight changes, if any:  No  IPPS:  4 SHIM:  5  Bowel/Bladder complaints, if any:  No  Nausea/Vomiting, if any: No  Pain issues, if any:  0/10  SAFETY ISSUES: Prior radiation? No Pacemaker/ICD? No Possible current pregnancy? Male Is the patient on methotrexate? No  Current Complaints / other details:  Robotic Radical Prostatectomy and BPLND on 07/03/2017.

## 2021-09-28 NOTE — Progress Notes (Signed)
Radiation Oncology         [phone]) [phone] ________________________________  Initial Outpatient Consultation  Name: Lawrence Cummings. MRN: [id number]  Date: 09/29/2021  DOB: Mar 14, 1955  CC:Pcp, No  Raynelle Bring, MD   REFERRING PHYSICIAN: Raynelle Bring, MD  DIAGNOSIS: 66 y.o. gentleman with a rising PSA of 0.169 s/p RALP 06/2017 for Stage pT3a, Gleason 4+5 prostate cancer    ICD-10-CM   1. Biochemically recurrent malignant neoplasm of prostate [facility])  C61    R97.21       HISTORY OF PRESENT ILLNESS: Lawrence Cummings. is a 66 y.o. male with a diagnosis of biochemically recurrent prostate cancer. We met the patient in our multidisciplinary prostate clinic on 06/21/17. In summary, he was initially referred to Dr. Junious Silk for an elevated PSA of 6.3. He was subsequently diagnosed with Gleason 4+5 prostate cancer on 04/19/17. Initial staging PSMA PET scan on 05/10/17 was negative. He opted to proceed with RALP on 06/30/17 under the care of Dr. Alinda Money. Pathology revealed Gleason 4+5 prostatic adenocarcinoma with extraprostatic extension at the right posterior base. Seminal vesicles, lymph nodes, and margins were all negative. His postoperative PSA was undetectable and remained undetectable until 11/2020, at which time it became low detectable at 0.1. Since that time, it has slowly risen, reaching 0.169 in 08/2021.  The patient reviewed the PSA results with his urologist and he has kindly been referred today for discussion of potential radiation treatment options.   PREVIOUS RADIATION THERAPY: No  PAST MEDICAL HISTORY:  Past Medical History:  Diagnosis Date   GERD (gastroesophageal reflux disease)    occasional   Gout    Prostate cancer ([employer])       PAST SURGICAL HISTORY: Past Surgical History:  Procedure Laterality Date   COLONOSCOPY     LYMPHADENECTOMY Bilateral 06/30/2017   Procedure: LYMPHADENECTOMY, PELVIC;  Surgeon: Raynelle Bring, MD;  Location: [facility];  Service: Urology;   Laterality: Bilateral;   PROSTATE BIOPSY     ROBOT ASSISTED LAPAROSCOPIC RADICAL PROSTATECTOMY N/A 06/30/2017   Procedure: XI ROBOTIC ASSISTED LAPAROSCOPIC RADICAL PROSTATECTOMY LEVEL 2;  Surgeon: Raynelle Bring, MD;  Location: [facility];  Service: Urology;  Laterality: N/A;    FAMILY HISTORY:  Family History  Problem Relation Age of Onset   Prostate cancer Paternal Uncle    Liver cancer Father    Stomach cancer Maternal Grandfather     SOCIAL HISTORY:  Social History   Socioeconomic History   Marital status: Married    Spouse name: Jaysten Essner   Number of children: 2   Years of education: Not on file   Highest education level: Not on file  Occupational History    Comment: manager  Tobacco Use   Smoking status: Every Day    Packs/day: 0.50    Years: 30.00    Total pack years: 15.00    Types: Cigarettes   Smokeless tobacco: Never  Vaping Use   Vaping Use: Never used  Substance and Sexual Activity   Alcohol use: Yes    Comment: 2-3 beers/day   Drug use: No   Sexual activity: Yes  Other Topics Concern   Not on file  Social History Narrative   Not on file   Social Determinants of Health   Financial Resource Strain: Not on file  Food Insecurity: Not on file  Transportation Needs: Not on file  Physical Activity: Not on file  Stress: Not on file  Social Connections: Not on file  Intimate Partner Violence: Not on  file    ALLERGIES: Patient has no known allergies.  MEDICATIONS:  Current Outpatient Medications  Medication Sig Dispense Refill   sildenafil (VIAGRA) 100 MG tablet SMARTSIG:1 Tablet(s) By Mouth As Directed PRN     No current facility-administered medications for this encounter.    REVIEW OF SYSTEMS:  On review of systems, the patient reports that he is doing well overall. He denies any chest pain, shortness of breath, cough, fevers, chills, night sweats, unintended weight changes. He denies any bowel disturbances, and denies abdominal pain, nausea or  vomiting. He denies any new musculoskeletal or joint aches or pains. His IPSS was 4, indicating mild urinary symptoms. 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 A complete review of systems is obtained and is otherwise negative.  PHYSICAL EXAM:  Wt Readings from Last 3 Encounters:  09/29/21 196 lb 3.2 oz (89 kg)  09/29/21 196 lb 3.2 oz (89 kg)  06/30/17 203 lb (92.1 kg)   Temp Readings from Last 3 Encounters:  09/29/21 98.9 F (37.2 C) (Oral)  09/29/21 98.9 F (37.2 C) (Oral)  07/02/17 99.1 F (37.3 C) (Oral)   BP Readings from Last 3 Encounters:  09/29/21 (!) 155/89  09/29/21 (!) 155/89  07/02/17 113/80   Pulse Readings from Last 3 Encounters:  09/29/21 (!) 113  09/29/21 (!) 113  07/02/17 90   Pain Assessment Pain Score: 0-No pain/10  In general this is a well appearing African American male in no acute distress. He's alert and oriented x4 and appropriate throughout the examination. Cardiopulmonary assessment is negative for acute distress, and he exhibits normal effort.     KPS = 100  100 - Normal; no complaints; no evidence of disease. 90   - Able to carry on normal activity; minor signs or symptoms of disease. 80   - Normal activity with effort; some signs or symptoms of disease. 24   - Cares for self; unable to carry on normal activity or to do active work. 60   - Requires occasional assistance, but is able to care for most of his personal needs. 50   - Requires considerable assistance and frequent medical care. 70   - Disabled; requires special care and assistance. 52   - Severely disabled; hospital admission is indicated although death not imminent. 53   - Very sick; hospital admission necessary; active supportive treatment necessary. 10   - Moribund; fatal processes progressing rapidly. 0     - Dead  Karnofsky DA, Abelmann [place], Craver LS and Burchenal [facility] [phone]) The use of the nitrogen mustards in the palliative treatment of carcinoma:  with particular reference to bronchogenic carcinoma Cancer 1 634-56  LABORATORY DATA:  Lab Results  Component Value Date   WBC 5.5 06/27/2017   HGB 8.6 (L) 07/02/2017   HCT 24.9 (L) 07/02/2017   MCV 95.4 06/27/2017   PLT 215 06/27/2017   Lab Results  Component Value Date   NA 141 06/27/2017   K 4.4 06/27/2017   CL 107 06/27/2017   CO2 23 06/27/2017   No results found for: "ALT", "AST", "GGT", "ALKPHOS", "BILITOT"   RADIOGRAPHY: No results found.    IMPRESSION/PLAN: 1. 66 y.o. gentleman with a rising PSA of 0.169 s/p RALP 06/2017 for Stage pT3a, Gleason 4+5 prostate cancer Today we reviewed the findings and workup thus far.  We discussed the natural history of prostate cancer.  We reviewed the the implications of positive margins, extracapsular extension, and seminal vesicle involvement on the risk  of prostate cancer recurrence. In his case, extraprostatic extension was present and he now has a rising, detectable post-operative PSA. We reviewed some of the evidence suggesting an advantage for patients who undergo salvage radiotherapy in this setting which can lead to excellent results in terms of disease control and survival. We discussed radiation treatment directed to the prostatic fossa and pelvic lymph nodes with regard to the logistics and delivery of external beam radiation treatment. We also detailed the role of ST-ADT in the treatment of biochemically recurrent high risk prostate cancer and outlined the associated side effects that could be expected with this therapy. He was encouraged to ask questions that were answered to his stated satisfaction.  At the conclusion of our conversation, the patient is interested in moving forward with 7.5 weeks of salvage external beam therapy concurrent with ST-ADT. We will share our discussion with Dr. Alinda Money and coordinate for the start of ADT on 10/20/21, prior to simulation that same day if possible. The patient appears to have a good  understanding of his disease and our treatment recommendations which are of curative intent and is in agreement with the stated plan.  He has freely signed written consent to proceed today in the office and a copy of this document will be placed in his medical record. Therefore, we will move forward with treatment planning accordingly, in anticipation of beginning IMRT in the near future.   We personally spent 60 minutes in this encounter including chart review, reviewing radiological studies, meeting face-to-face with the patient, entering orders and completing documentation.    Nicholos Johns, PA-C    Tyler Pita, MD  [facility] Direct Dial: [phone]  Fax: [phone] [email or web address]  Skype  LinkedIn   This document serves as a record of services personally performed by Tyler Pita, MD and Freeman Caldron, PA-C. It was created on their behalf by Wilburn Mylar, a trained medical scribe. The creation of this record is based on the scribe's personal observations and the provider's statements to them. This document has been checked and approved by the attending provider.

## 2021-09-29 ENCOUNTER — Other Ambulatory Visit: Payer: Self-pay

## 2021-09-29 ENCOUNTER — Ambulatory Visit
Admission: RE | Admit: 2021-09-29 | Discharge: 2021-09-29 | Disposition: A | Discharge status: Home / Self Care | Payer: 59 | Source: Ambulatory Visit | Attending: Radiation Oncology | Admitting: Radiation Oncology

## 2021-09-29 VITALS — BP 155/89 | HR 113 | Temp 98.9°F | Resp 19 | Ht 72.0 in | Wt 196.2 lb

## 2021-09-29 DIAGNOSIS — Z8042 Family history of malignant neoplasm of prostate: Secondary | ICD-10-CM | POA: Insufficient documentation

## 2021-09-29 DIAGNOSIS — F17210 Nicotine dependence, cigarettes, uncomplicated: Secondary | ICD-10-CM | POA: Diagnosis not present

## 2021-09-29 DIAGNOSIS — C61 Malignant neoplasm of prostate: Secondary | ICD-10-CM | POA: Diagnosis not present

## 2021-09-29 DIAGNOSIS — K219 Gastro-esophageal reflux disease without esophagitis: Secondary | ICD-10-CM | POA: Diagnosis not present

## 2021-09-29 DIAGNOSIS — Z800 Family history of malignant neoplasm of digestive organs: Secondary | ICD-10-CM | POA: Insufficient documentation

## 2021-09-29 DIAGNOSIS — R9721 Rising PSA following treatment for malignant neoplasm of prostate: Secondary | ICD-10-CM | POA: Diagnosis not present

## 2021-09-29 NOTE — Progress Notes (Signed)
Introduced myself to the patient as the prostate nurse navigator.  No barriers to care identified at this time.  He is here to discuss his radiation treatment options, post surgery from 2019.  I gave him my business card and asked him to call me with questions or concerns.  Verbalized understanding.

## 2021-10-01 NOTE — Progress Notes (Signed)
Patient was recent a RadOnc for a rising PSA of 0.169 s/p RALP 06/2017 for Stage pT3a, Gleason 4+5 prostate cancer.    Treatment decision confirmed for ADT and radiation.   ADT will be administered on 9/26, followed by CT Simulation.    Pt aware, no further needs at this time.

## 2021-10-20 ENCOUNTER — Ambulatory Visit
Admission: RE | Admit: 2021-10-20 | Discharge: 2021-10-20 | Disposition: A | Discharge status: Home / Self Care | Payer: 59 | Source: Ambulatory Visit | Attending: Radiation Oncology | Admitting: Radiation Oncology

## 2021-10-20 ENCOUNTER — Other Ambulatory Visit: Payer: Self-pay

## 2021-10-20 DIAGNOSIS — C61 Malignant neoplasm of prostate: Secondary | ICD-10-CM | POA: Insufficient documentation

## 2021-10-20 DIAGNOSIS — R9721 Rising PSA following treatment for malignant neoplasm of prostate: Secondary | ICD-10-CM

## 2021-10-20 NOTE — Progress Notes (Signed)
  Radiation Oncology         [phone]) [phone] ________________________________  Name: Lawrence Cummings. MRN: [id number]  Date: 10/20/2021  DOB: November 21, 1955  SIMULATION AND TREATMENT PLANNING NOTE    ICD-10-CM   1. Biochemically recurrent malignant neoplasm of prostate [facility])  C61    R97.21       DIAGNOSIS:  66 y.o. gentleman with a rising PSA of 0.169 s/p RALP 06/2017 for Stage pT3a, Gleason 4+5 prostate cancer  NARRATIVE:  The patient was brought to the [facility].  Identity was confirmed.  All relevant records and images related to the planned course of therapy were reviewed.  The patient freely provided informed written consent to proceed with treatment after reviewing the details related to the planned course of therapy. The consent form was witnessed and verified by the simulation staff.  Then, the patient was set-up in a stable reproducible supine position for radiation therapy.  A vacuum lock pillow device was custom fabricated to position his legs in a reproducible immobilized position.  Then, I performed a urethrogram under sterile conditions to identify the prostatic bed.  CT images were obtained.  Surface markings were placed.  The CT images were loaded into the planning software.  Then the prostate bed target, pelvic lymph node target and avoidance structures including the rectum, bladder, bowel and hips were contoured.  Treatment planning then occurred.  The radiation prescription was entered and confirmed.  A total of one complex treatment devices were fabricated. I have requested : Intensity Modulated Radiotherapy (IMRT) is medically necessary for this case for the following reason:  Rectal sparing.[name] Kitchen  PLAN:  The patient will receive 45 Gy in 25 fractions of 1.8 Gy, followed by a boost to the prostate bed to a total dose of 68.4 Gy with 13 additional fractions of 1.8 Gy.   ________________________________  Sheral Apley Tammi Klippel, M.D.

## 2021-10-23 DIAGNOSIS — C61 Malignant neoplasm of prostate: Secondary | ICD-10-CM | POA: Diagnosis not present

## 2021-10-29 ENCOUNTER — Other Ambulatory Visit: Payer: Self-pay

## 2021-10-29 ENCOUNTER — Ambulatory Visit
Admission: RE | Admit: 2021-10-29 | Discharge: 2021-10-29 | Disposition: A | Discharge status: Home / Self Care | Payer: 59 | Source: Ambulatory Visit | Attending: Radiation Oncology | Admitting: Radiation Oncology

## 2021-10-29 DIAGNOSIS — C61 Malignant neoplasm of prostate: Secondary | ICD-10-CM | POA: Insufficient documentation

## 2021-10-29 DIAGNOSIS — Z510 Encounter for antineoplastic radiation therapy: Secondary | ICD-10-CM | POA: Diagnosis not present

## 2021-10-29 LAB — RAD ONC ARIA SESSION SUMMARY
Course Elapsed Days: 0
Plan Fractions Treated to Date: 1
Plan Prescribed Dose Per Fraction: 1.8 Gy
Plan Total Fractions Prescribed: 25
Plan Total Prescribed Dose: 45 Gy
Reference Point Dosage Given to Date: 1.8 Gy
Reference Point Session Dosage Given: 1.8 Gy
Session Number: 1

## 2021-10-30 ENCOUNTER — Ambulatory Visit
Admission: RE | Admit: 2021-10-30 | Discharge: 2021-10-30 | Disposition: A | Discharge status: Home / Self Care | Payer: 59 | Source: Ambulatory Visit | Attending: Radiation Oncology | Admitting: Radiation Oncology

## 2021-10-30 ENCOUNTER — Other Ambulatory Visit: Payer: Self-pay

## 2021-10-30 DIAGNOSIS — Z510 Encounter for antineoplastic radiation therapy: Secondary | ICD-10-CM | POA: Diagnosis not present

## 2021-10-30 LAB — RAD ONC ARIA SESSION SUMMARY
Course Elapsed Days: 1
Plan Fractions Treated to Date: 2
Plan Prescribed Dose Per Fraction: 1.8 Gy
Plan Total Fractions Prescribed: 25
Plan Total Prescribed Dose: 45 Gy
Reference Point Dosage Given to Date: 3.6 Gy
Reference Point Session Dosage Given: 1.8 Gy
Session Number: 2

## 2021-11-02 ENCOUNTER — Ambulatory Visit
Admission: RE | Admit: 2021-11-02 | Discharge: 2021-11-02 | Disposition: A | Discharge status: Home / Self Care | Payer: 59 | Source: Ambulatory Visit | Attending: Radiation Oncology | Admitting: Radiation Oncology

## 2021-11-02 ENCOUNTER — Other Ambulatory Visit: Payer: Self-pay

## 2021-11-02 DIAGNOSIS — Z510 Encounter for antineoplastic radiation therapy: Secondary | ICD-10-CM | POA: Diagnosis not present

## 2021-11-02 LAB — RAD ONC ARIA SESSION SUMMARY
Course Elapsed Days: 4
Plan Fractions Treated to Date: 3
Plan Prescribed Dose Per Fraction: 1.8 Gy
Plan Total Fractions Prescribed: 25
Plan Total Prescribed Dose: 45 Gy
Reference Point Dosage Given to Date: 5.4 Gy
Reference Point Session Dosage Given: 1.8 Gy
Session Number: 3

## 2021-11-03 ENCOUNTER — Other Ambulatory Visit: Payer: Self-pay

## 2021-11-03 ENCOUNTER — Ambulatory Visit
Admission: RE | Admit: 2021-11-03 | Discharge: 2021-11-03 | Disposition: A | Discharge status: Home / Self Care | Payer: 59 | Source: Ambulatory Visit | Attending: Radiation Oncology | Admitting: Radiation Oncology

## 2021-11-03 DIAGNOSIS — Z510 Encounter for antineoplastic radiation therapy: Secondary | ICD-10-CM | POA: Diagnosis not present

## 2021-11-03 LAB — RAD ONC ARIA SESSION SUMMARY
Course Elapsed Days: 5
Plan Fractions Treated to Date: 4
Plan Prescribed Dose Per Fraction: 1.8 Gy
Plan Total Fractions Prescribed: 25
Plan Total Prescribed Dose: 45 Gy
Reference Point Dosage Given to Date: 7.2 Gy
Reference Point Session Dosage Given: 1.8 Gy
Session Number: 4

## 2021-11-04 ENCOUNTER — Other Ambulatory Visit: Payer: Self-pay

## 2021-11-04 ENCOUNTER — Ambulatory Visit
Admission: RE | Admit: 2021-11-04 | Discharge: 2021-11-04 | Disposition: A | Discharge status: Home / Self Care | Payer: 59 | Source: Ambulatory Visit | Attending: Radiation Oncology | Admitting: Radiation Oncology

## 2021-11-04 DIAGNOSIS — Z510 Encounter for antineoplastic radiation therapy: Secondary | ICD-10-CM | POA: Diagnosis not present

## 2021-11-04 LAB — RAD ONC ARIA SESSION SUMMARY
Course Elapsed Days: 6
Plan Fractions Treated to Date: 5
Plan Prescribed Dose Per Fraction: 1.8 Gy
Plan Total Fractions Prescribed: 25
Plan Total Prescribed Dose: 45 Gy
Reference Point Dosage Given to Date: 9 Gy
Reference Point Session Dosage Given: 1.8 Gy
Session Number: 5

## 2021-11-05 ENCOUNTER — Ambulatory Visit
Admission: RE | Admit: 2021-11-05 | Discharge: 2021-11-05 | Disposition: A | Discharge status: Home / Self Care | Payer: 59 | Source: Ambulatory Visit | Attending: Radiation Oncology | Admitting: Radiation Oncology

## 2021-11-05 ENCOUNTER — Other Ambulatory Visit: Payer: Self-pay

## 2021-11-05 ENCOUNTER — Ambulatory Visit: Payer: 59

## 2021-11-05 DIAGNOSIS — Z510 Encounter for antineoplastic radiation therapy: Secondary | ICD-10-CM | POA: Diagnosis not present

## 2021-11-05 LAB — RAD ONC ARIA SESSION SUMMARY
Course Elapsed Days: 7
Plan Fractions Treated to Date: 6
Plan Prescribed Dose Per Fraction: 1.8 Gy
Plan Total Fractions Prescribed: 25
Plan Total Prescribed Dose: 45 Gy
Reference Point Dosage Given to Date: 10.8 Gy
Reference Point Session Dosage Given: 1.8 Gy
Session Number: 6

## 2021-11-06 ENCOUNTER — Ambulatory Visit
Admission: RE | Admit: 2021-11-06 | Discharge: 2021-11-06 | Disposition: A | Discharge status: Home / Self Care | Payer: 59 | Source: Ambulatory Visit | Attending: Radiation Oncology | Admitting: Radiation Oncology

## 2021-11-06 ENCOUNTER — Other Ambulatory Visit: Payer: Self-pay

## 2021-11-06 DIAGNOSIS — Z510 Encounter for antineoplastic radiation therapy: Secondary | ICD-10-CM | POA: Diagnosis not present

## 2021-11-06 LAB — RAD ONC ARIA SESSION SUMMARY
Course Elapsed Days: 8
Plan Fractions Treated to Date: 7
Plan Prescribed Dose Per Fraction: 1.8 Gy
Plan Total Fractions Prescribed: 25
Plan Total Prescribed Dose: 45 Gy
Reference Point Dosage Given to Date: 12.6 Gy
Reference Point Session Dosage Given: 1.8 Gy
Session Number: 7

## 2021-11-09 ENCOUNTER — Other Ambulatory Visit: Payer: Self-pay

## 2021-11-09 ENCOUNTER — Ambulatory Visit
Admission: RE | Admit: 2021-11-09 | Discharge: 2021-11-09 | Disposition: A | Discharge status: Home / Self Care | Payer: 59 | Source: Ambulatory Visit | Attending: Radiation Oncology | Admitting: Radiation Oncology

## 2021-11-09 DIAGNOSIS — Z510 Encounter for antineoplastic radiation therapy: Secondary | ICD-10-CM | POA: Diagnosis not present

## 2021-11-09 LAB — RAD ONC ARIA SESSION SUMMARY
Course Elapsed Days: 11
Plan Fractions Treated to Date: 8
Plan Prescribed Dose Per Fraction: 1.8 Gy
Plan Total Fractions Prescribed: 25
Plan Total Prescribed Dose: 45 Gy
Reference Point Dosage Given to Date: 14.4 Gy
Reference Point Session Dosage Given: 1.8 Gy
Session Number: 8

## 2021-11-10 ENCOUNTER — Ambulatory Visit
Admission: RE | Admit: 2021-11-10 | Discharge: 2021-11-10 | Disposition: A | Discharge status: Home / Self Care | Payer: 59 | Source: Ambulatory Visit | Attending: Radiation Oncology | Admitting: Radiation Oncology

## 2021-11-10 ENCOUNTER — Other Ambulatory Visit: Payer: Self-pay

## 2021-11-10 DIAGNOSIS — Z510 Encounter for antineoplastic radiation therapy: Secondary | ICD-10-CM | POA: Diagnosis not present

## 2021-11-10 LAB — RAD ONC ARIA SESSION SUMMARY
Course Elapsed Days: 12
Plan Fractions Treated to Date: 9
Plan Prescribed Dose Per Fraction: 1.8 Gy
Plan Total Fractions Prescribed: 25
Plan Total Prescribed Dose: 45 Gy
Reference Point Dosage Given to Date: 16.2 Gy
Reference Point Session Dosage Given: 1.8 Gy
Session Number: 9

## 2021-11-11 ENCOUNTER — Ambulatory Visit
Admission: RE | Admit: 2021-11-11 | Discharge: 2021-11-11 | Disposition: A | Discharge status: Home / Self Care | Payer: 59 | Source: Ambulatory Visit | Attending: Radiation Oncology | Admitting: Radiation Oncology

## 2021-11-11 ENCOUNTER — Other Ambulatory Visit: Payer: Self-pay

## 2021-11-11 DIAGNOSIS — Z510 Encounter for antineoplastic radiation therapy: Secondary | ICD-10-CM | POA: Diagnosis not present

## 2021-11-11 LAB — RAD ONC ARIA SESSION SUMMARY
Course Elapsed Days: 13
Plan Fractions Treated to Date: 10
Plan Prescribed Dose Per Fraction: 1.8 Gy
Plan Total Fractions Prescribed: 25
Plan Total Prescribed Dose: 45 Gy
Reference Point Dosage Given to Date: 18 Gy
Reference Point Session Dosage Given: 1.8 Gy
Session Number: 10

## 2021-11-12 ENCOUNTER — Other Ambulatory Visit: Payer: Self-pay

## 2021-11-12 ENCOUNTER — Ambulatory Visit
Admission: RE | Admit: 2021-11-12 | Discharge: 2021-11-12 | Disposition: A | Discharge status: Home / Self Care | Payer: 59 | Source: Ambulatory Visit | Attending: Radiation Oncology | Admitting: Radiation Oncology

## 2021-11-12 DIAGNOSIS — Z510 Encounter for antineoplastic radiation therapy: Secondary | ICD-10-CM | POA: Diagnosis not present

## 2021-11-12 LAB — RAD ONC ARIA SESSION SUMMARY
Course Elapsed Days: 14
Plan Fractions Treated to Date: 11
Plan Prescribed Dose Per Fraction: 1.8 Gy
Plan Total Fractions Prescribed: 25
Plan Total Prescribed Dose: 45 Gy
Reference Point Dosage Given to Date: 19.8 Gy
Reference Point Session Dosage Given: 1.8 Gy
Session Number: 11

## 2021-11-13 ENCOUNTER — Ambulatory Visit
Admission: RE | Admit: 2021-11-13 | Discharge: 2021-11-13 | Disposition: A | Discharge status: Home / Self Care | Payer: 59 | Source: Ambulatory Visit | Attending: Radiation Oncology | Admitting: Radiation Oncology

## 2021-11-13 ENCOUNTER — Other Ambulatory Visit: Payer: Self-pay

## 2021-11-13 DIAGNOSIS — Z510 Encounter for antineoplastic radiation therapy: Secondary | ICD-10-CM | POA: Diagnosis not present

## 2021-11-13 LAB — RAD ONC ARIA SESSION SUMMARY
Course Elapsed Days: 15
Plan Fractions Treated to Date: 12
Plan Prescribed Dose Per Fraction: 1.8 Gy
Plan Total Fractions Prescribed: 25
Plan Total Prescribed Dose: 45 Gy
Reference Point Dosage Given to Date: 21.6 Gy
Reference Point Session Dosage Given: 1.8 Gy
Session Number: 12

## 2021-11-16 ENCOUNTER — Other Ambulatory Visit: Payer: Self-pay

## 2021-11-16 ENCOUNTER — Ambulatory Visit
Admission: RE | Admit: 2021-11-16 | Discharge: 2021-11-16 | Disposition: A | Discharge status: Home / Self Care | Payer: 59 | Source: Ambulatory Visit | Attending: Radiation Oncology | Admitting: Radiation Oncology

## 2021-11-16 DIAGNOSIS — Z510 Encounter for antineoplastic radiation therapy: Secondary | ICD-10-CM | POA: Diagnosis not present

## 2021-11-16 LAB — RAD ONC ARIA SESSION SUMMARY
Course Elapsed Days: 18
Plan Fractions Treated to Date: 13
Plan Prescribed Dose Per Fraction: 1.8 Gy
Plan Total Fractions Prescribed: 25
Plan Total Prescribed Dose: 45 Gy
Reference Point Dosage Given to Date: 23.4 Gy
Reference Point Session Dosage Given: 1.8 Gy
Session Number: 13

## 2021-11-17 ENCOUNTER — Ambulatory Visit
Admission: RE | Admit: 2021-11-17 | Discharge: 2021-11-17 | Disposition: A | Discharge status: Home / Self Care | Payer: 59 | Source: Ambulatory Visit | Attending: Radiation Oncology | Admitting: Radiation Oncology

## 2021-11-17 ENCOUNTER — Other Ambulatory Visit: Payer: Self-pay

## 2021-11-17 DIAGNOSIS — Z510 Encounter for antineoplastic radiation therapy: Secondary | ICD-10-CM | POA: Diagnosis not present

## 2021-11-17 LAB — RAD ONC ARIA SESSION SUMMARY
Course Elapsed Days: 19
Plan Fractions Treated to Date: 14
Plan Prescribed Dose Per Fraction: 1.8 Gy
Plan Total Fractions Prescribed: 25
Plan Total Prescribed Dose: 45 Gy
Reference Point Dosage Given to Date: 25.2 Gy
Reference Point Session Dosage Given: 1.8 Gy
Session Number: 14

## 2021-11-18 ENCOUNTER — Ambulatory Visit
Admission: RE | Admit: 2021-11-18 | Discharge: 2021-11-18 | Disposition: A | Discharge status: Home / Self Care | Payer: 59 | Source: Ambulatory Visit | Attending: Radiation Oncology | Admitting: Radiation Oncology

## 2021-11-18 ENCOUNTER — Other Ambulatory Visit: Payer: Self-pay

## 2021-11-18 DIAGNOSIS — Z510 Encounter for antineoplastic radiation therapy: Secondary | ICD-10-CM | POA: Diagnosis not present

## 2021-11-18 LAB — RAD ONC ARIA SESSION SUMMARY
Course Elapsed Days: 20
Plan Fractions Treated to Date: 15
Plan Prescribed Dose Per Fraction: 1.8 Gy
Plan Total Fractions Prescribed: 25
Plan Total Prescribed Dose: 45 Gy
Reference Point Dosage Given to Date: 27 Gy
Reference Point Session Dosage Given: 1.8 Gy
Session Number: 15

## 2021-11-19 ENCOUNTER — Other Ambulatory Visit: Payer: Self-pay

## 2021-11-19 ENCOUNTER — Ambulatory Visit
Admission: RE | Admit: 2021-11-19 | Discharge: 2021-11-19 | Disposition: A | Discharge status: Home / Self Care | Payer: 59 | Source: Ambulatory Visit | Attending: Radiation Oncology | Admitting: Radiation Oncology

## 2021-11-19 DIAGNOSIS — Z510 Encounter for antineoplastic radiation therapy: Secondary | ICD-10-CM | POA: Diagnosis not present

## 2021-11-19 LAB — RAD ONC ARIA SESSION SUMMARY
Course Elapsed Days: 21
Plan Fractions Treated to Date: 16
Plan Prescribed Dose Per Fraction: 1.8 Gy
Plan Total Fractions Prescribed: 25
Plan Total Prescribed Dose: 45 Gy
Reference Point Dosage Given to Date: 28.8 Gy
Reference Point Session Dosage Given: 1.8 Gy
Session Number: 16

## 2021-11-20 ENCOUNTER — Other Ambulatory Visit: Payer: Self-pay

## 2021-11-20 ENCOUNTER — Ambulatory Visit
Admission: RE | Admit: 2021-11-20 | Discharge: 2021-11-20 | Disposition: A | Discharge status: Home / Self Care | Payer: 59 | Source: Ambulatory Visit | Attending: Radiation Oncology | Admitting: Radiation Oncology

## 2021-11-20 DIAGNOSIS — Z510 Encounter for antineoplastic radiation therapy: Secondary | ICD-10-CM | POA: Diagnosis not present

## 2021-11-20 LAB — RAD ONC ARIA SESSION SUMMARY
Course Elapsed Days: 22
Plan Fractions Treated to Date: 17
Plan Prescribed Dose Per Fraction: 1.8 Gy
Plan Total Fractions Prescribed: 25
Plan Total Prescribed Dose: 45 Gy
Reference Point Dosage Given to Date: 30.6 Gy
Reference Point Session Dosage Given: 1.8 Gy
Session Number: 17

## 2021-11-23 ENCOUNTER — Other Ambulatory Visit: Payer: Self-pay

## 2021-11-23 ENCOUNTER — Ambulatory Visit
Admission: RE | Admit: 2021-11-23 | Discharge: 2021-11-23 | Disposition: A | Discharge status: Home / Self Care | Payer: 59 | Source: Ambulatory Visit | Attending: Radiation Oncology | Admitting: Radiation Oncology

## 2021-11-23 DIAGNOSIS — Z510 Encounter for antineoplastic radiation therapy: Secondary | ICD-10-CM | POA: Diagnosis not present

## 2021-11-23 LAB — RAD ONC ARIA SESSION SUMMARY
Course Elapsed Days: 25
Plan Fractions Treated to Date: 18
Plan Prescribed Dose Per Fraction: 1.8 Gy
Plan Total Fractions Prescribed: 25
Plan Total Prescribed Dose: 45 Gy
Reference Point Dosage Given to Date: 32.4 Gy
Reference Point Session Dosage Given: 1.8 Gy
Session Number: 18

## 2021-11-24 ENCOUNTER — Ambulatory Visit
Admission: RE | Admit: 2021-11-24 | Discharge: 2021-11-24 | Disposition: A | Discharge status: Home / Self Care | Payer: 59 | Source: Ambulatory Visit | Attending: Radiation Oncology | Admitting: Radiation Oncology

## 2021-11-24 ENCOUNTER — Other Ambulatory Visit: Payer: Self-pay

## 2021-11-24 DIAGNOSIS — Z510 Encounter for antineoplastic radiation therapy: Secondary | ICD-10-CM | POA: Diagnosis not present

## 2021-11-24 LAB — RAD ONC ARIA SESSION SUMMARY
Course Elapsed Days: 26
Plan Fractions Treated to Date: 19
Plan Prescribed Dose Per Fraction: 1.8 Gy
Plan Total Fractions Prescribed: 25
Plan Total Prescribed Dose: 45 Gy
Reference Point Dosage Given to Date: 34.2 Gy
Reference Point Session Dosage Given: 1.8 Gy
Session Number: 19

## 2021-11-25 ENCOUNTER — Other Ambulatory Visit: Payer: Self-pay

## 2021-11-25 ENCOUNTER — Ambulatory Visit
Admission: RE | Admit: 2021-11-25 | Discharge: 2021-11-25 | Disposition: A | Discharge status: Home / Self Care | Payer: 59 | Source: Ambulatory Visit | Attending: Radiation Oncology | Admitting: Radiation Oncology

## 2021-11-25 DIAGNOSIS — Z510 Encounter for antineoplastic radiation therapy: Secondary | ICD-10-CM | POA: Diagnosis not present

## 2021-11-25 DIAGNOSIS — C61 Malignant neoplasm of prostate: Secondary | ICD-10-CM | POA: Diagnosis present

## 2021-11-25 LAB — RAD ONC ARIA SESSION SUMMARY
Course Elapsed Days: 27
Plan Fractions Treated to Date: 20
Plan Prescribed Dose Per Fraction: 1.8 Gy
Plan Total Fractions Prescribed: 25
Plan Total Prescribed Dose: 45 Gy
Reference Point Dosage Given to Date: 36 Gy
Reference Point Session Dosage Given: 1.8 Gy
Session Number: 20

## 2021-11-26 ENCOUNTER — Ambulatory Visit
Admission: RE | Admit: 2021-11-26 | Discharge: 2021-11-26 | Disposition: A | Discharge status: Home / Self Care | Payer: 59 | Source: Ambulatory Visit | Attending: Radiation Oncology | Admitting: Radiation Oncology

## 2021-11-26 ENCOUNTER — Other Ambulatory Visit: Payer: Self-pay

## 2021-11-26 DIAGNOSIS — Z510 Encounter for antineoplastic radiation therapy: Secondary | ICD-10-CM | POA: Diagnosis not present

## 2021-11-26 LAB — RAD ONC ARIA SESSION SUMMARY
Course Elapsed Days: 28
Plan Fractions Treated to Date: 21
Plan Prescribed Dose Per Fraction: 1.8 Gy
Plan Total Fractions Prescribed: 25
Plan Total Prescribed Dose: 45 Gy
Reference Point Dosage Given to Date: 37.8 Gy
Reference Point Session Dosage Given: 1.8 Gy
Session Number: 21

## 2021-11-27 ENCOUNTER — Other Ambulatory Visit: Payer: Self-pay

## 2021-11-27 ENCOUNTER — Ambulatory Visit
Admission: RE | Admit: 2021-11-27 | Discharge: 2021-11-27 | Disposition: A | Discharge status: Home / Self Care | Payer: 59 | Source: Ambulatory Visit | Attending: Radiation Oncology | Admitting: Radiation Oncology

## 2021-11-27 DIAGNOSIS — Z510 Encounter for antineoplastic radiation therapy: Secondary | ICD-10-CM | POA: Diagnosis not present

## 2021-11-27 LAB — RAD ONC ARIA SESSION SUMMARY
Course Elapsed Days: 29
Plan Fractions Treated to Date: 22
Plan Prescribed Dose Per Fraction: 1.8 Gy
Plan Total Fractions Prescribed: 25
Plan Total Prescribed Dose: 45 Gy
Reference Point Dosage Given to Date: 39.6 Gy
Reference Point Session Dosage Given: 1.8 Gy
Session Number: 22

## 2021-11-30 ENCOUNTER — Ambulatory Visit
Admission: RE | Admit: 2021-11-30 | Discharge: 2021-11-30 | Disposition: A | Discharge status: Home / Self Care | Payer: 59 | Source: Ambulatory Visit | Attending: Radiation Oncology | Admitting: Radiation Oncology

## 2021-11-30 ENCOUNTER — Other Ambulatory Visit: Payer: Self-pay

## 2021-11-30 DIAGNOSIS — Z510 Encounter for antineoplastic radiation therapy: Secondary | ICD-10-CM | POA: Diagnosis not present

## 2021-11-30 LAB — RAD ONC ARIA SESSION SUMMARY
Course Elapsed Days: 32
Plan Fractions Treated to Date: 23
Plan Prescribed Dose Per Fraction: 1.8 Gy
Plan Total Fractions Prescribed: 25
Plan Total Prescribed Dose: 45 Gy
Reference Point Dosage Given to Date: 41.4 Gy
Reference Point Session Dosage Given: 1.8 Gy
Session Number: 23

## 2021-12-01 ENCOUNTER — Ambulatory Visit
Admission: RE | Admit: 2021-12-01 | Discharge: 2021-12-01 | Disposition: A | Discharge status: Home / Self Care | Payer: 59 | Source: Ambulatory Visit | Attending: Radiation Oncology | Admitting: Radiation Oncology

## 2021-12-01 ENCOUNTER — Other Ambulatory Visit: Payer: Self-pay

## 2021-12-01 DIAGNOSIS — Z510 Encounter for antineoplastic radiation therapy: Secondary | ICD-10-CM | POA: Diagnosis not present

## 2021-12-01 LAB — RAD ONC ARIA SESSION SUMMARY
Course Elapsed Days: 33
Plan Fractions Treated to Date: 24
Plan Prescribed Dose Per Fraction: 1.8 Gy
Plan Total Fractions Prescribed: 25
Plan Total Prescribed Dose: 45 Gy
Reference Point Dosage Given to Date: 43.2 Gy
Reference Point Session Dosage Given: 1.8 Gy
Session Number: 24

## 2021-12-02 ENCOUNTER — Other Ambulatory Visit: Payer: Self-pay

## 2021-12-02 ENCOUNTER — Ambulatory Visit
Admission: RE | Admit: 2021-12-02 | Discharge: 2021-12-02 | Disposition: A | Discharge status: Home / Self Care | Payer: 59 | Source: Ambulatory Visit | Attending: Radiation Oncology | Admitting: Radiation Oncology

## 2021-12-02 DIAGNOSIS — Z510 Encounter for antineoplastic radiation therapy: Secondary | ICD-10-CM | POA: Diagnosis not present

## 2021-12-02 LAB — RAD ONC ARIA SESSION SUMMARY
Course Elapsed Days: 34
Plan Fractions Treated to Date: 25
Plan Prescribed Dose Per Fraction: 1.8 Gy
Plan Total Fractions Prescribed: 25
Plan Total Prescribed Dose: 45 Gy
Reference Point Dosage Given to Date: 45 Gy
Reference Point Session Dosage Given: 1.8 Gy
Session Number: 25

## 2021-12-03 ENCOUNTER — Ambulatory Visit
Admission: RE | Admit: 2021-12-03 | Discharge: 2021-12-03 | Disposition: A | Discharge status: Home / Self Care | Payer: 59 | Source: Ambulatory Visit | Attending: Radiation Oncology | Admitting: Radiation Oncology

## 2021-12-03 ENCOUNTER — Ambulatory Visit: Payer: 59

## 2021-12-03 ENCOUNTER — Other Ambulatory Visit: Payer: Self-pay

## 2021-12-03 DIAGNOSIS — Z510 Encounter for antineoplastic radiation therapy: Secondary | ICD-10-CM | POA: Diagnosis not present

## 2021-12-03 LAB — RAD ONC ARIA SESSION SUMMARY
Course Elapsed Days: 35
Plan Fractions Treated to Date: 1
Plan Prescribed Dose Per Fraction: 1.8 Gy
Plan Total Fractions Prescribed: 13
Plan Total Prescribed Dose: 23.4 Gy
Reference Point Dosage Given to Date: 1.8 Gy
Reference Point Session Dosage Given: 1.8 Gy
Session Number: 26

## 2021-12-04 ENCOUNTER — Ambulatory Visit: Payer: 59

## 2021-12-04 ENCOUNTER — Ambulatory Visit
Admission: RE | Admit: 2021-12-04 | Discharge: 2021-12-04 | Disposition: A | Discharge status: Home / Self Care | Payer: 59 | Source: Ambulatory Visit | Attending: Radiation Oncology | Admitting: Radiation Oncology

## 2021-12-04 ENCOUNTER — Other Ambulatory Visit: Payer: Self-pay

## 2021-12-04 DIAGNOSIS — Z510 Encounter for antineoplastic radiation therapy: Secondary | ICD-10-CM | POA: Diagnosis not present

## 2021-12-04 LAB — RAD ONC ARIA SESSION SUMMARY
Course Elapsed Days: 36
Plan Fractions Treated to Date: 2
Plan Prescribed Dose Per Fraction: 1.8 Gy
Plan Total Fractions Prescribed: 13
Plan Total Prescribed Dose: 23.4 Gy
Reference Point Dosage Given to Date: 3.6 Gy
Reference Point Session Dosage Given: 1.8 Gy
Session Number: 27

## 2021-12-07 ENCOUNTER — Other Ambulatory Visit: Payer: Self-pay

## 2021-12-07 ENCOUNTER — Ambulatory Visit
Admission: RE | Admit: 2021-12-07 | Discharge: 2021-12-07 | Disposition: A | Discharge status: Home / Self Care | Payer: 59 | Source: Ambulatory Visit | Attending: Radiation Oncology | Admitting: Radiation Oncology

## 2021-12-07 DIAGNOSIS — Z510 Encounter for antineoplastic radiation therapy: Secondary | ICD-10-CM | POA: Diagnosis not present

## 2021-12-07 LAB — RAD ONC ARIA SESSION SUMMARY
Course Elapsed Days: 39
Plan Fractions Treated to Date: 3
Plan Prescribed Dose Per Fraction: 1.8 Gy
Plan Total Fractions Prescribed: 13
Plan Total Prescribed Dose: 23.4 Gy
Reference Point Dosage Given to Date: 5.4 Gy
Reference Point Session Dosage Given: 1.8 Gy
Session Number: 28

## 2021-12-08 ENCOUNTER — Ambulatory Visit
Admission: RE | Admit: 2021-12-08 | Discharge: 2021-12-08 | Disposition: A | Discharge status: Home / Self Care | Payer: 59 | Source: Ambulatory Visit | Attending: Radiation Oncology | Admitting: Radiation Oncology

## 2021-12-08 ENCOUNTER — Other Ambulatory Visit: Payer: Self-pay

## 2021-12-08 DIAGNOSIS — Z510 Encounter for antineoplastic radiation therapy: Secondary | ICD-10-CM | POA: Diagnosis not present

## 2021-12-08 LAB — RAD ONC ARIA SESSION SUMMARY
Course Elapsed Days: 40
Plan Fractions Treated to Date: 4
Plan Prescribed Dose Per Fraction: 1.8 Gy
Plan Total Fractions Prescribed: 13
Plan Total Prescribed Dose: 23.4 Gy
Reference Point Dosage Given to Date: 7.2 Gy
Reference Point Session Dosage Given: 1.8 Gy
Session Number: 29

## 2021-12-09 ENCOUNTER — Other Ambulatory Visit: Payer: Self-pay

## 2021-12-09 ENCOUNTER — Ambulatory Visit
Admission: RE | Admit: 2021-12-09 | Discharge: 2021-12-09 | Disposition: A | Discharge status: Home / Self Care | Payer: 59 | Source: Ambulatory Visit | Attending: Radiation Oncology | Admitting: Radiation Oncology

## 2021-12-09 DIAGNOSIS — Z510 Encounter for antineoplastic radiation therapy: Secondary | ICD-10-CM | POA: Diagnosis not present

## 2021-12-09 LAB — RAD ONC ARIA SESSION SUMMARY
Course Elapsed Days: 41
Plan Fractions Treated to Date: 5
Plan Prescribed Dose Per Fraction: 1.8 Gy
Plan Total Fractions Prescribed: 13
Plan Total Prescribed Dose: 23.4 Gy
Reference Point Dosage Given to Date: 9 Gy
Reference Point Session Dosage Given: 1.8 Gy
Session Number: 30

## 2021-12-10 ENCOUNTER — Other Ambulatory Visit: Payer: Self-pay

## 2021-12-10 ENCOUNTER — Ambulatory Visit
Admission: RE | Admit: 2021-12-10 | Discharge: 2021-12-10 | Disposition: A | Discharge status: Home / Self Care | Payer: 59 | Source: Ambulatory Visit | Attending: Radiation Oncology | Admitting: Radiation Oncology

## 2021-12-10 DIAGNOSIS — Z510 Encounter for antineoplastic radiation therapy: Secondary | ICD-10-CM | POA: Diagnosis not present

## 2021-12-10 LAB — RAD ONC ARIA SESSION SUMMARY
Course Elapsed Days: 42
Plan Fractions Treated to Date: 6
Plan Prescribed Dose Per Fraction: 1.8 Gy
Plan Total Fractions Prescribed: 13
Plan Total Prescribed Dose: 23.4 Gy
Reference Point Dosage Given to Date: 10.8 Gy
Reference Point Session Dosage Given: 1.8 Gy
Session Number: 31

## 2021-12-11 ENCOUNTER — Other Ambulatory Visit: Payer: Self-pay

## 2021-12-11 ENCOUNTER — Ambulatory Visit
Admission: RE | Admit: 2021-12-11 | Discharge: 2021-12-11 | Disposition: A | Discharge status: Home / Self Care | Payer: 59 | Source: Ambulatory Visit | Attending: Radiation Oncology | Admitting: Radiation Oncology

## 2021-12-11 DIAGNOSIS — Z510 Encounter for antineoplastic radiation therapy: Secondary | ICD-10-CM | POA: Diagnosis not present

## 2021-12-11 LAB — RAD ONC ARIA SESSION SUMMARY
Course Elapsed Days: 43
Plan Fractions Treated to Date: 7
Plan Prescribed Dose Per Fraction: 1.8 Gy
Plan Total Fractions Prescribed: 13
Plan Total Prescribed Dose: 23.4 Gy
Reference Point Dosage Given to Date: 12.6 Gy
Reference Point Session Dosage Given: 1.8 Gy
Session Number: 32

## 2021-12-13 ENCOUNTER — Ambulatory Visit
Admission: RE | Admit: 2021-12-13 | Discharge: 2021-12-13 | Disposition: A | Discharge status: Home / Self Care | Payer: 59 | Source: Ambulatory Visit | Attending: Radiation Oncology | Admitting: Radiation Oncology

## 2021-12-13 ENCOUNTER — Other Ambulatory Visit: Payer: Self-pay

## 2021-12-13 DIAGNOSIS — Z510 Encounter for antineoplastic radiation therapy: Secondary | ICD-10-CM | POA: Diagnosis not present

## 2021-12-13 LAB — RAD ONC ARIA SESSION SUMMARY
Course Elapsed Days: 45
Plan Fractions Treated to Date: 8
Plan Prescribed Dose Per Fraction: 1.8 Gy
Plan Total Fractions Prescribed: 13
Plan Total Prescribed Dose: 23.4 Gy
Reference Point Dosage Given to Date: 14.4 Gy
Reference Point Session Dosage Given: 1.8 Gy
Session Number: 33

## 2021-12-14 ENCOUNTER — Other Ambulatory Visit: Payer: Self-pay

## 2021-12-14 ENCOUNTER — Ambulatory Visit
Admission: RE | Admit: 2021-12-14 | Discharge: 2021-12-14 | Disposition: A | Discharge status: Home / Self Care | Payer: 59 | Source: Ambulatory Visit | Attending: Radiation Oncology | Admitting: Radiation Oncology

## 2021-12-14 DIAGNOSIS — Z510 Encounter for antineoplastic radiation therapy: Secondary | ICD-10-CM | POA: Diagnosis not present

## 2021-12-14 LAB — RAD ONC ARIA SESSION SUMMARY
Course Elapsed Days: 46
Plan Fractions Treated to Date: 9
Plan Prescribed Dose Per Fraction: 1.8 Gy
Plan Total Fractions Prescribed: 13
Plan Total Prescribed Dose: 23.4 Gy
Reference Point Dosage Given to Date: 16.2 Gy
Reference Point Session Dosage Given: 1.8 Gy
Session Number: 34

## 2021-12-15 ENCOUNTER — Ambulatory Visit
Admission: RE | Admit: 2021-12-15 | Discharge: 2021-12-15 | Disposition: A | Discharge status: Home / Self Care | Payer: 59 | Source: Ambulatory Visit | Attending: Radiation Oncology | Admitting: Radiation Oncology

## 2021-12-15 ENCOUNTER — Other Ambulatory Visit: Payer: Self-pay

## 2021-12-15 DIAGNOSIS — Z510 Encounter for antineoplastic radiation therapy: Secondary | ICD-10-CM | POA: Diagnosis not present

## 2021-12-15 LAB — RAD ONC ARIA SESSION SUMMARY
Course Elapsed Days: 47
Plan Fractions Treated to Date: 10
Plan Prescribed Dose Per Fraction: 1.8 Gy
Plan Total Fractions Prescribed: 13
Plan Total Prescribed Dose: 23.4 Gy
Reference Point Dosage Given to Date: 18 Gy
Reference Point Session Dosage Given: 1.8 Gy
Session Number: 35

## 2021-12-16 ENCOUNTER — Ambulatory Visit
Admission: RE | Admit: 2021-12-16 | Discharge: 2021-12-16 | Disposition: A | Discharge status: Home / Self Care | Payer: 59 | Source: Ambulatory Visit | Attending: Radiation Oncology | Admitting: Radiation Oncology

## 2021-12-16 ENCOUNTER — Other Ambulatory Visit: Payer: Self-pay

## 2021-12-16 ENCOUNTER — Ambulatory Visit: Payer: 59

## 2021-12-16 DIAGNOSIS — Z510 Encounter for antineoplastic radiation therapy: Secondary | ICD-10-CM | POA: Diagnosis not present

## 2021-12-16 LAB — RAD ONC ARIA SESSION SUMMARY
Course Elapsed Days: 48
Plan Fractions Treated to Date: 11
Plan Prescribed Dose Per Fraction: 1.8 Gy
Plan Total Fractions Prescribed: 13
Plan Total Prescribed Dose: 23.4 Gy
Reference Point Dosage Given to Date: 19.8 Gy
Reference Point Session Dosage Given: 1.8 Gy
Session Number: 36

## 2021-12-21 ENCOUNTER — Ambulatory Visit
Admission: RE | Admit: 2021-12-21 | Discharge: 2021-12-21 | Disposition: A | Discharge status: Home / Self Care | Payer: 59 | Source: Ambulatory Visit | Attending: Radiation Oncology | Admitting: Radiation Oncology

## 2021-12-21 ENCOUNTER — Other Ambulatory Visit: Payer: Self-pay

## 2021-12-21 ENCOUNTER — Ambulatory Visit: Payer: 59

## 2021-12-21 DIAGNOSIS — Z510 Encounter for antineoplastic radiation therapy: Secondary | ICD-10-CM | POA: Diagnosis not present

## 2021-12-21 LAB — RAD ONC ARIA SESSION SUMMARY
Course Elapsed Days: 53
Plan Fractions Treated to Date: 12
Plan Prescribed Dose Per Fraction: 1.8 Gy
Plan Total Fractions Prescribed: 13
Plan Total Prescribed Dose: 23.4 Gy
Reference Point Dosage Given to Date: 21.6 Gy
Reference Point Session Dosage Given: 1.8 Gy
Session Number: 37

## 2021-12-22 ENCOUNTER — Encounter: Payer: Self-pay | Admitting: Urology

## 2021-12-22 ENCOUNTER — Ambulatory Visit: Payer: 59

## 2021-12-22 ENCOUNTER — Ambulatory Visit
Admission: RE | Admit: 2021-12-22 | Discharge: 2021-12-22 | Disposition: A | Discharge status: Home / Self Care | Payer: 59 | Source: Ambulatory Visit | Attending: Radiation Oncology | Admitting: Radiation Oncology

## 2021-12-22 ENCOUNTER — Other Ambulatory Visit: Payer: Self-pay

## 2021-12-22 DIAGNOSIS — Z510 Encounter for antineoplastic radiation therapy: Secondary | ICD-10-CM | POA: Diagnosis not present

## 2021-12-22 LAB — RAD ONC ARIA SESSION SUMMARY
Course Elapsed Days: 54
Plan Fractions Treated to Date: 13
Plan Prescribed Dose Per Fraction: 1.8 Gy
Plan Total Fractions Prescribed: 13
Plan Total Prescribed Dose: 23.4 Gy
Reference Point Dosage Given to Date: 23.4 Gy
Reference Point Session Dosage Given: 1.8 Gy
Session Number: 38

## 2021-12-23 ENCOUNTER — Ambulatory Visit: Payer: 59

## 2022-01-01 NOTE — Progress Notes (Signed)
                                                                                                                                                             Patient Name: Lawrence Cummings MRN: [id number] DOB: 1955-10-23 Referring Physician: Dutch Gray (Profile Not Attached) Date of Service: 12/22/2021 [facility], [place]                                                        End Of Treatment Note  Diagnoses: C61-Malignant neoplasm of prostate  Cancer Staging:  66 y.o. gentleman with a rising PSA of 0.169 s/p RALP 06/2017 for Stage pT3a, Gleason 4+5 prostate cancer   Intent: Curative  Radiation Treatment Dates: 10/29/2021 through 12/22/2021 Site Technique Total Dose (Gy) Dose per Fx (Gy) Completed Fx Beam Energies  Prostate Bed: ProstBed_Pelvis IMRT 45/45 1.8 25/25 6X  Prostate Bed: ProstBed_Bst IMRT 23.4/23.4 1.8 13/13 6X   Narrative: The patient tolerated radiation therapy relatively well with only minor urinary symptoms and modest fatigue.  He did report increased nocturia and occasional diarrhea.  Plan: The patient will receive a call in about one month from the radiation oncology department. He will continue follow up with Dr. Alinda Money as well.   Nicholos Johns, PA-C    Tyler Pita, MD  [facility] Direct Dial: [phone]  Fax: [phone] [email or web address]  Skype  LinkedIn

## 2022-01-19 ENCOUNTER — Ambulatory Visit
Admission: RE | Admit: 2022-01-19 | Discharge: 2022-01-19 | Disposition: A | Discharge status: Home / Self Care | Payer: 59 | Source: Ambulatory Visit | Attending: Radiation Oncology | Admitting: Radiation Oncology

## 2022-01-19 NOTE — Progress Notes (Signed)
  Radiation Oncology         [phone]) [phone] ________________________________  Name: Lawrence Cummings. MRN: [id number]  Date of Service: 01/19/2022  DOB: 1955/03/20  Post Treatment Telephone Note  Diagnosis:   66 y.o. gentleman with a rising PSA of 0.169 s/p RALP 06/2017 for Stage pT3a, Gleason 4+5 prostate cancer   Intent: Curative  Radiation Treatment Dates: 10/29/2021 through 12/22/2021 Site Technique Total Dose (Gy) Dose per Fx (Gy) Completed Fx Beam Energies  Prostate Bed: ProstBed_Pelvis IMRT 45/45 1.8 25/25 6X  Prostate Bed: ProstBed_Bst IMRT 23.4/23.4 1.8 13/13 6X   (as documented in provider EOT note)   Pre Treatment IPSS Score: 4 (as documented in the provider consult note)   The patient was not available for call today. A voicemail was left.   Patient has a scheduled follow up visit with his urologist, Dr. Alinda Money, on 03/31/22 for ongoing surveillance. He was counseled that PSA levels will be drawn in the urology office, and was reassured that additional time is expected to improve bowel and bladder symptoms. He was encouraged to call back with concerns or questions regarding radiation.   Leandra Kern, LPN

## 2022-01-21 ENCOUNTER — Ambulatory Visit: Payer: Self-pay | Admitting: Urology

## 2022-04-05 ENCOUNTER — Other Ambulatory Visit: Payer: Self-pay | Admitting: Family Medicine

## 2022-04-05 DIAGNOSIS — G1220 Motor neuron disease, unspecified: Secondary | ICD-10-CM

## 2022-04-05 NOTE — Progress Notes (Signed)
52mo of LUE and LLE muscle spasms. Lasts for 15 min. Rare initially but now occurring TID. Triggers are movement related. LUE w/ decreased gross motor function and strength.   CT head w/ and w/o ordered.

## 2022-05-03 ENCOUNTER — Ambulatory Visit
Admission: RE | Admit: 2022-05-03 | Discharge: 2022-05-03 | Disposition: A | Discharge status: Home / Self Care | Payer: 59 | Source: Ambulatory Visit | Attending: Family Medicine | Admitting: Family Medicine

## 2022-05-03 DIAGNOSIS — G1220 Motor neuron disease, unspecified: Secondary | ICD-10-CM

## 2022-05-03 MED ORDER — IOPAMIDOL (ISOVUE-300) INJECTION 61%
75.0000 mL | Freq: Once | INTRAVENOUS | Status: AC | PRN
  Administered 2022-05-03: 75 mL via INTRAVENOUS

## 2022-06-29 ENCOUNTER — Emergency Department (HOSPITAL_COMMUNITY)
Admission: EM | Admit: 2022-06-29 | Discharge: 2022-06-29 | Disposition: A | Discharge status: Home / Self Care | Payer: 59 | Attending: Emergency Medicine | Admitting: Emergency Medicine

## 2022-06-29 ENCOUNTER — Other Ambulatory Visit: Payer: Self-pay

## 2022-06-29 ENCOUNTER — Encounter (HOSPITAL_COMMUNITY): Payer: Self-pay

## 2022-06-29 DIAGNOSIS — K922 Gastrointestinal hemorrhage, unspecified: Secondary | ICD-10-CM | POA: Insufficient documentation

## 2022-06-29 LAB — COMPREHENSIVE METABOLIC PANEL
ALT: 19 U/L (ref 0–44)
AST: 24 U/L (ref 15–41)
Albumin: 3.8 g/dL (ref 3.5–5.0)
Alkaline Phosphatase: 85 U/L (ref 38–126)
Anion gap: 12 (ref 5–15)
BUN: 11 mg/dL (ref 8–23)
CO2: 21 mmol/L — ABNORMAL LOW (ref 22–32)
Calcium: 9.1 mg/dL (ref 8.9–10.3)
Chloride: 104 mmol/L (ref 98–111)
Creatinine, Ser: 1.11 mg/dL (ref 0.61–1.24)
GFR, Estimated: >60 mL/min (ref >60)
Glucose, Bld: 85 mg/dL (ref 70–99)
Potassium: 4.1 mmol/L (ref 3.5–5.1)
Sodium: 137 mmol/L (ref 135–145)
Total Bilirubin: 0.6 mg/dL (ref 0.3–1.2)
Total Protein: 7.5 g/dL (ref 6.5–8.1)

## 2022-06-29 LAB — CBC
HCT: 38 % — ABNORMAL LOW (ref 39.0–52.0)
Hemoglobin: 12.7 g/dL — ABNORMAL LOW (ref 13.0–17.0)
MCH: 32.9 pg (ref 26.0–34.0)
MCHC: 33.4 g/dL (ref 30.0–36.0)
MCV: 98.4 fL (ref 80.0–100.0)
Platelets: 215 10*3/uL (ref 150–400)
RBC: 3.86 MIL/uL — ABNORMAL LOW (ref 4.22–5.81)
RDW: 12.9 % (ref 11.5–15.5)
WBC: 5.2 10*3/uL (ref 4.0–10.5)
nRBC: 0 % (ref 0.0–0.2)

## 2022-06-29 NOTE — ED Provider Triage Note (Signed)
Emergency Medicine Provider Triage Evaluation Note  Lawrence Cummings. , a 67 y.o. male  was evaluated in triage.  Pt complains of bright red blood per rectum.  Has noticed 2 episodes since last Thursday.  Most recently today.  No lightheadedness, abdominal pain, shortness of breath, or other complaints.  He states it was not significant amount of blood.  He did notice that upon wiping as well.  No history of hemorrhoids..  Review of Systems  Positive: As above Negative: As above  Physical Exam  BP (!) 141/83 (BP Location: Right Arm)   Pulse 74   Temp 98.8 F (37.1 C)   Resp 20   Ht 6' (1.829 m)   Wt 89 kg   SpO2 98%   BMI 26.61 kg/m  Gen:   Awake, no distress   Resp:  Normal effort  MSK:   Moves extremities without difficulty  Other:    Medical Decision Making  Medically screening exam initiated at 6:09 PM.  Appropriate orders placed.  Lawrence Cummings. was informed that the remainder of the evaluation will be completed by another provider, this initial triage assessment does not replace that evaluation, and the importance of remaining in the ED until their evaluation is complete.     Marita [place], PA-C 06/29/22 1810

## 2022-06-29 NOTE — ED Triage Notes (Addendum)
Pt c/o bright red blood in stool today. Pt denies abd pain or blood thinners. Pt denies weakness, dizziness or SOB

## 2022-06-29 NOTE — ED Provider Notes (Signed)
  [place] EMERGENCY DEPARTMENT AT [facility] Provider Note   CSN: [id number] Arrival date & time: 06/29/22  1756     History  Chief Complaint  Patient presents with   GI Bleeding    Lawrence Cummings. is a 67 y.o. male.  HPI Patient presents with GI bleeding.  States red blood in the stool.  States stool was more normal colored.  Not lightheadedness or dizziness.  Some blood with wiping also.  Not on blood thinners.  No abdominal pain.  States stool is a little softer.    Home Medications Prior to Admission medications   Medication Sig Start Date End Date Taking? Authorizing Provider  sildenafil (VIAGRA) 100 MG tablet SMARTSIG:1 Tablet(s) By Mouth As Directed PRN 04/22/21   [provider]      Allergies    Patient has no known allergies.    Review of Systems   Review of Systems  Physical Exam Updated Vital Signs BP 130/80 (BP Location: Right Arm)   Pulse 80   Temp 98.6 F (37 C) (Oral)   Resp 20   Ht 6' (1.829 m)   Wt 89 kg   SpO2 100%   BMI 26.61 kg/m  Physical Exam Vitals reviewed.  Constitutional:      Appearance: Normal appearance.  Cardiovascular:     Rate and Rhythm: Regular rhythm.  Pulmonary:     Breath sounds: No wheezing.  Abdominal:     Tenderness: There is no abdominal tenderness.  Musculoskeletal:        General: No tenderness.  Skin:    General: Skin is warm.     Coloration: Skin is not jaundiced or pale.  Neurological:     Mental Status: He is alert.     ED Results / Procedures / Treatments   Labs (all labs ordered are listed, but only abnormal results are displayed) Labs Reviewed  COMPREHENSIVE METABOLIC PANEL - Abnormal; Notable for the following components:      Result Value   CO2 21 (*)    All other components within normal limits  CBC - Abnormal; Notable for the following components:   RBC 3.86 (*)    Hemoglobin 12.7 (*)    HCT 38.0 (*)    All other components within normal limits  POC OCCULT  BLOOD, ED  TYPE AND SCREEN    EKG None  Radiology No results found.  Procedures Procedures    Medications Ordered in ED Medications - No data to display  ED Course/ Medical Decision Making/ A&P                             Medical Decision Making Amount and/or Complexity of Data Reviewed Labs: ordered.   Patient with likely lower GI bleeding.  Blood in stool.  Hemoglobin is overall reassuring.  Higher than previous although previous was about 5 years ago.  Reassuring vitals.  Will have follow-up with gastroenterology.  Will either go with [facility] or will go through PCP.        Final Clinical Impression(s) / ED Diagnoses Final diagnoses:  Lower GI bleed    Rx / DC Orders ED Discharge Orders     None         Benjiman Core, MD 06/29/22 2143

## 2022-06-30 ENCOUNTER — Encounter: Payer: Self-pay | Admitting: Gastroenterology

## 2022-10-12 ENCOUNTER — Encounter: Payer: Self-pay | Admitting: Gastroenterology

## 2022-10-12 ENCOUNTER — Other Ambulatory Visit (INDEPENDENT_AMBULATORY_CARE_PROVIDER_SITE_OTHER): Payer: 59

## 2022-10-12 ENCOUNTER — Ambulatory Visit (INDEPENDENT_AMBULATORY_CARE_PROVIDER_SITE_OTHER): Payer: 59 | Admitting: Gastroenterology

## 2022-10-12 VITALS — BP 134/72 | HR 90 | Ht 72.0 in | Wt 203.4 lb

## 2022-10-12 DIAGNOSIS — R9721 Rising PSA following treatment for malignant neoplasm of prostate: Secondary | ICD-10-CM | POA: Diagnosis not present

## 2022-10-12 DIAGNOSIS — R197 Diarrhea, unspecified: Secondary | ICD-10-CM

## 2022-10-12 DIAGNOSIS — K921 Melena: Secondary | ICD-10-CM

## 2022-10-12 DIAGNOSIS — C61 Malignant neoplasm of prostate: Secondary | ICD-10-CM | POA: Diagnosis not present

## 2022-10-12 DIAGNOSIS — R195 Other fecal abnormalities: Secondary | ICD-10-CM

## 2022-10-12 LAB — TSH: TSH: 1.52 u[IU]/mL (ref 0.35–5.50)

## 2022-10-12 LAB — C-REACTIVE PROTEIN: CRP: <1 mg/dL (ref 0.5–20.0)

## 2022-10-12 LAB — SEDIMENTATION RATE: Sed Rate: 26 mm/h — ABNORMAL HIGH (ref 0–20)

## 2022-10-12 NOTE — Patient Instructions (Signed)
_______________________________________________________  If your blood pressure at your visit was 140/90 or greater, please contact your primary care physician to follow up on this.  _______________________________________________________  If you are age 65 or older, your body mass index should be between 23-30. Your Body mass index is 27.59 kg/m. If this is out of the aforementioned range listed, please consider follow up with your Primary Care Provider.  If you are age 45 or younger, your body mass index should be between 19-25. Your Body mass index is 27.59 kg/m. If this is out of the aformentioned range listed, please consider follow up with your Primary Care Provider.   ________________________________________________________  The [facility] providers would like to encourage you to use [facility] to communicate with providers for non-urgent requests or questions.  Due to long hold times on the telephone, sending your provider a message by [facility] may be a faster and more efficient way to get a response.  Please allow 48 business hours for a response.  Please remember that this is for non-urgent requests.  _______________________________________________________  Your provider has requested that you go to the basement level for lab work before leaving today. Press "B" on the elevator. The lab is located at the first door on the left as you exit the elevator.  Your provider has ordered "Diatherix" stool testing for you. You have received a kit from our office today containing all necessary supplies to complete this test. Please carefully read the stool collection instructions provided in the kit before opening the accompanying materials. In addition, be sure to place the label from the top left corner of the laboratory request sheet onto the "puritan opti-swab" tube that is supplied in the kit. This label should include your full name and date of birth. After completing the test, you should secure  the purtian tube into the specimen biohazard bag. The laboratory request information sheet (including date and time of specimen collection) should be placed into the outside pocket of the specimen biohazard bag and returned to the [facility] lab with 2 days of collection.   We will see about records from [facility]  It was a pleasure to see you today!  Lawrence Cummings, D.O.

## 2022-10-12 NOTE — Progress Notes (Addendum)
Chief Complaint: Hematochezia, diarrhea   Referring Provider:     Ozella Rocks, MD   HPI:     Lawrence Cummings. is a 67 y.o. male with a history of prostate cancer s/p robotic assisted laparoscopic prostatectomy 06/2017, then recurrence in 2023 treated with radiation (completed 11/2021).  He is referred to the [facility]ology Clinic for evaluation of rectal bleeding.  Started having diarrhea about 3 months ago, described as watery, nonbloody stools.  Has been having 3-4 stools/day.  Baseline is 2-3 formed stools/day.  Then had 1-2 isolated episodes of BRBPR in early June.    He was seen in the ER for this issue on 06/29/2022.  H/H 12.7/38 with MCV/RDW 98/13.  Normal CMP.  Was discharged home.  He reports no hematochezia since that ER evaluation in June, but continues to have diarrhea.  No nocturnal symptoms.  Improves with imodium, but he limits this to once/week (will control sxs for 1-2 days when he takes).  No prior similar sxs. No preceding Abx, hospitalizations, etc. No abdominal pain, n/v/f/c.   No recent abdominal imaging for review.  Does not take any medications.  Has had colonoscopies by [facility], with last being 2-3 years ago. No hx of polyps. Will request those records for review.   No known family history of CRC, GI malignancy, liver disease, pancreatic disease, or IBD.     Past Medical History:  Diagnosis Date   GERD (gastroesophageal reflux disease)    occasional   Gout    Prostate cancer [facility])      Past Surgical History:  Procedure Laterality Date   COLONOSCOPY     LYMPHADENECTOMY Bilateral 06/30/2017   Procedure: LYMPHADENECTOMY, PELVIC;  Surgeon: Heloise Purpura, MD;  Location: [facility];  Service: Urology;  Laterality: Bilateral;   PROSTATE BIOPSY     ROBOT ASSISTED LAPAROSCOPIC RADICAL PROSTATECTOMY N/A 06/30/2017   Procedure: XI ROBOTIC ASSISTED LAPAROSCOPIC RADICAL PROSTATECTOMY LEVEL 2;  Surgeon: Heloise Purpura, MD;  Location: [facility];   Service: Urology;  Laterality: N/A;   Family History  Problem Relation Age of Onset   Breast cancer Mother    Liver cancer Father    Stomach cancer Maternal Grandfather    Prostate cancer Paternal Uncle    Colon cancer Neg Hx    Esophageal cancer Neg Hx    Colon polyps Neg Hx    Social History   Tobacco Use   Smoking status: Every Day    Current packs/day: 0.50    Average packs/day: 0.5 packs/day for 30.0 years (15.0 ttl pk-yrs)    Types: Cigarettes   Smokeless tobacco: Never  Vaping Use   Vaping status: Never Used  Substance Use Topics   Alcohol use: Yes    Alcohol/week: 21.0 standard drinks of alcohol    Types: 21 Cans of beer per week   Drug use: No   Current Outpatient Medications  Medication Sig Dispense Refill   sildenafil (VIAGRA) 100 MG tablet SMARTSIG:1 Tablet(s) By Mouth As Directed PRN (Patient not taking: Reported on 10/12/2022)     No current facility-administered medications for this visit.   No Known Allergies   Review of Systems: All systems reviewed and negative except where noted in HPI.     Physical Exam:    Wt Readings from Last 3 Encounters:  10/12/22 203 lb 6.4 oz (92.3 kg)  06/29/22 196 lb 3.4 oz (89 kg)  09/29/21 196 lb 3.2 oz (  89 kg)    BP 134/72   Pulse 90   Ht 6' (1.829 m)   Wt 203 lb 6.4 oz (92.3 kg)   SpO2 99%   BMI 27.59 kg/m  Constitutional:  Pleasant, in no acute distress. Psychiatric: Normal mood and affect. Behavior is normal. Cardiovascular: Normal rate, regular rhythm. No edema Pulmonary/chest: Effort normal and breath sounds normal. No wheezing, rales or rhonchi. Abdominal: Soft, nondistended, nontender. Bowel sounds active throughout. There are no masses palpable. No hepatomegaly. Neurological: Alert and oriented to person place and time. Skin: Skin is warm and dry. No rashes noted. Rectal: Exam deferred by patient    ASSESSMENT AND PLAN;   1) Diarrhea 2) Change in bowel habits 67 year old male with 3+ month  history of diarrhea/change in stools.  No preceding changes in medications (in fact does not take any medications at all), antibiotics, hospitalizations, travel, etc.  Discussed broad DDx with plan as follows:  - Fecal calprotectin, pancreatic elastase, GI PCR panel via [facility] - ESR, CRP - TSH - Celiac panel - If all normal, may plan for colonoscopy +/- EGD - Increase dietary fiber and add fiber supplement as needed  3) Hematochezia Isolated episode of small-volume BRBPR in early June.  Benign evaluation in the ER and symptoms have not recurred.  Discussed potential etiologies with plan as follows: - Obtain records from [facility] and review of last colonoscopy - Depending on workup as above, may plan for colonoscopy  4) History of prostate cancer s/p robotic assisted laparoscopic prostatectomy 06/2017, then recurrence in 2023 treated with radiation, completed 11/2021. -Did discuss the possibility of radiation proctitis which will be evaluated at time of colonoscopy with endoscopic treatment as appropriate   Shellia Cleverly, DO, FACG  10/12/2022, 9:13 AM   Ozella Rocks, MD   Addendum: Received some limited records from [facility] including the following: - 02/10/2016: Colonoscopy: A few medium mouth diverticula in the transverse colon and ascending colon, otherwise normal colonoscopy.  Recommended repeat in 10 years  Will scan copy into EMR.

## 2022-10-13 ENCOUNTER — Other Ambulatory Visit: Payer: 59

## 2022-10-13 DIAGNOSIS — R197 Diarrhea, unspecified: Secondary | ICD-10-CM

## 2022-10-13 DIAGNOSIS — R195 Other fecal abnormalities: Secondary | ICD-10-CM

## 2022-10-14 ENCOUNTER — Other Ambulatory Visit: Payer: Self-pay | Admitting: *Deleted

## 2022-10-14 DIAGNOSIS — R768 Other specified abnormal immunological findings in serum: Secondary | ICD-10-CM

## 2022-10-14 DIAGNOSIS — R197 Diarrhea, unspecified: Secondary | ICD-10-CM

## 2022-10-16 LAB — CALPROTECTIN, FECAL: Calprotectin, Fecal: <5 ug/g (ref 0–120)

## 2022-10-19 ENCOUNTER — Other Ambulatory Visit: Payer: 59

## 2022-10-19 DIAGNOSIS — R768 Other specified abnormal immunological findings in serum: Secondary | ICD-10-CM

## 2022-10-19 DIAGNOSIS — R197 Diarrhea, unspecified: Secondary | ICD-10-CM

## 2022-10-20 ENCOUNTER — Other Ambulatory Visit: Payer: Self-pay | Admitting: *Deleted

## 2022-10-20 DIAGNOSIS — R768 Other specified abnormal immunological findings in serum: Secondary | ICD-10-CM

## 2022-10-20 LAB — IGM: IgM, Serum: 135 mg/dL (ref 50–300)

## 2022-10-20 LAB — IGG: IgG (Immunoglobin G), Serum: 1788 mg/dL — ABNORMAL HIGH (ref 600–1540)

## 2022-10-23 LAB — PANCREATIC ELASTASE, FECAL: Pancreatic Elastase-1, Stool: >500 ug/g

## 2022-10-27 ENCOUNTER — Telehealth: Payer: Self-pay

## 2022-10-27 NOTE — Telephone Encounter (Signed)
Patient made aware of results that were negative and voiced understanding

## 2022-11-29 ENCOUNTER — Ambulatory Visit (INDEPENDENT_AMBULATORY_CARE_PROVIDER_SITE_OTHER): Payer: 59 | Admitting: Internal Medicine

## 2022-11-29 ENCOUNTER — Encounter: Payer: Self-pay | Admitting: Internal Medicine

## 2022-11-29 VITALS — BP 116/78 | HR 86 | Temp 98.0°F | Resp 18 | Ht 72.0 in | Wt 200.2 lb

## 2022-11-29 DIAGNOSIS — C61 Malignant neoplasm of prostate: Secondary | ICD-10-CM

## 2022-11-29 DIAGNOSIS — K529 Noninfective gastroenteritis and colitis, unspecified: Secondary | ICD-10-CM | POA: Diagnosis not present

## 2022-11-29 DIAGNOSIS — D892 Hypergammaglobulinemia, unspecified: Secondary | ICD-10-CM | POA: Diagnosis not present

## 2022-11-29 NOTE — Progress Notes (Signed)
NEW PATIENT Date of Service/Encounter:  11/29/22 Referring provider: Shellia Cleverly, DO Primary care provider: Ozella Rocks, MD  Subjective:  Lawrence Cummings. is a 67 y.o. male with a PMHx of chronic diarrhea presenting today for evaluation of elevated IgA and IgG antibodies History obtained from: chart review and patient.   Discussed the use of AI scribe software for clinical note transcription with the patient, who gave verbal consent to proceed.  History of Present Illness   The patient was referred for consultation due to elevated antibody levels detected in recent tests. He was not initially aware of the reason for the referral, but mentioned a chemical being "way offline" in a test by his gastroenterologist. He also reported experiencing diarrhea for several months, the cause of which remains undetermined despite a colonoscopy and consultation with a gastroenterologist. The patient mentioned a high intake of water and beer, and occasional use of Imodium D to manage the diarrhea.  The patient has a history of prostate cancer, diagnosed in June of either 2021 or 2022, and underwent surgery followed by radiation therapy due to a slight increase in some levels. He is due for follow-up tests later this month to assess his current status. The last time he recalls taking antibiotics was around the time of his prostate cancer surgery. He denied experiencing frequent infections or requiring any recent antibiotic treatment.     Chart Review:  Reviewed GI notes from 10/12/22: Plan: ") Diarrhea 2) Change in bowel habits 67 year old male with 3+ month history of diarrhea/change in stools.  No preceding changes in medications (in fact does not take any medications at all), antibiotics, hospitalizations, travel, etc.  Discussed broad DDx with plan as follows:   - Fecal calprotectin, pancreatic elastase, GI PCR panel via [facility] - ESR, CRP - TSH - Celiac panel - If all normal, may  plan for colonoscopy +/- EGD - Increase dietary fiber and add fiber supplement as needed   3) Hematochezia Isolated episode of small-volume BRBPR in early June.  Benign evaluation in the ER and symptoms have not recurred.  Discussed potential etiologies with plan as follows: - Obtain records from [facility] and review of last colonoscopy - Depending on workup as above, may plan for colonoscopy   4) History of prostate cancer s/p robotic assisted laparoscopic prostatectomy 06/2017, then recurrence in 2023 treated with radiation, completed 11/2021. -Did discuss the possibility of radiation proctitis which will be evaluated at time of colonoscopy with endoscopic treatment as appropriate"  Component     Latest Ref Rng 10/12/2022 10/19/2022  Immunoglobulin A     70 - 320 mg/dL 161 (H)    CRP     0.5 - 20.0 mg/dL <0.9    Sed Rate     0 - 20 mm/hr 26 (H)    IgM, Serum     50 - 300 mg/dL  604   IgG (Immunoglobin G), Serum     600 - 1,540 mg/dL  5,409 (H)     Legend: (H) High   CT Head and Neck 05/03/22: No acute intracranial pathology or evidence of intracranial metastatic disease.  Past Medical History: Past Medical History:  Diagnosis Date   GERD (gastroesophageal reflux disease)    occasional   Gout    Prostate cancer (HCC)    Medication List:  Current Outpatient Medications  Medication Sig Dispense Refill   sildenafil (VIAGRA) 100 MG tablet SMARTSIG:1 Tablet(s) By Mouth As Directed PRN (Patient not taking: Reported  on 10/12/2022)     No current facility-administered medications for this visit.   Known Allergies:  No Known Allergies Past Surgical History: Past Surgical History:  Procedure Laterality Date   COLONOSCOPY  02/10/2016   [facility] Diverticulosis in the transverse colon and in the ascending colon. The distal rectum and anal aerge are normal on retroflexion view. The examination was otherwise normal   LYMPHADENECTOMY Bilateral 06/30/2017   Procedure:  LYMPHADENECTOMY, PELVIC;  Surgeon: Heloise Purpura, MD;  Location: [facility];  Service: Urology;  Laterality: Bilateral;   PROSTATE BIOPSY     ROBOT ASSISTED LAPAROSCOPIC RADICAL PROSTATECTOMY N/A 06/30/2017   Procedure: XI ROBOTIC ASSISTED LAPAROSCOPIC RADICAL PROSTATECTOMY LEVEL 2;  Surgeon: Heloise Purpura, MD;  Location: [facility];  Service: Urology;  Laterality: N/A;   Family History: Family History  Problem Relation Age of Onset   Breast cancer Mother    Liver cancer Father    Asthma Brother    Prostate cancer Paternal Uncle    Stomach cancer Maternal Grandfather    Colon cancer Neg Hx    Esophageal cancer Neg Hx    Colon polyps Neg Hx    Social History: Lawrence Cummings lives in a house built 39 years ago, no water damage, carpet in bedroom, gas eating, central AC, no indoor pets, no roaches, not using dust mite covers on the bed of the pillows, no smoke exposure.  He works as a Air cabin crew.  + HEPA filter in the home.  [place]  He smokes menthol cigarettes 5 cigarettes/day for 30 years..   ROS:  All other systems negative except as noted per HPI.  Objective:  Blood pressure 116/78, pulse 86, temperature 98 F (36.7 C), temperature source Temporal, resp. rate 18, height 6' (1.829 m), weight 200 lb 3.2 oz (90.8 kg), SpO2 96%. Body mass index is 27.15 kg/m. Physical Exam:  General Appearance:  Alert, cooperative, no distress, appears stated age  Head:  Normocephalic, without obvious abnormality, atraumatic  Eyes:  Conjunctiva clear, EOM's intact  Ears EACs normal bilaterally and normal TMs bilaterally  Nose: Nares normal, normal mucosa and no visible anterior polyps  Throat: Lips, tongue normal; teeth and gums normal, normal posterior oropharynx  Neck: Supple, symmetrical  Lungs:   clear to auscultation bilaterally, Respirations unlabored, no coughing  Heart:  regular rate and rhythm and no murmur, Appears well perfused  Extremities: No  edema  Skin: Skin color, texture, turgor normal and no rashes or lesions on visualized portions of skin  Neurologic: No gross deficits   Diagnostics:  Labs:  Lab Orders         Lymph Enumeration, Basic & NK Cells         Diphtheria / Tetanus Antibody Panel         Complement, total         Strep pneumoniae 23 Serotypes IgG         Protein electrophoresis, serum         UPEP/TP, 24-Hr Urine       Assessment and Plan  Assessment and Plan    Elevated Antibody Levels-mild hypergammaglobulinemia Mildly elevated levels, not typically concerning. Discussed the possibility of overproduction of antibodies, which would require oncology referral due to possible lymphoproliferative disorders. No signs/symptoms of primary/secondary immunodeficiency.  -Order additional blood and urine tests to further evaluate the immune system. -If abnormal results are found, refer to oncology.  Chronic Diarrhea Ongoing for months, no clear cause identified by gastroenterology. No  associated symptoms of infection or antibiotic use. -No immediate changes to management. -Consider followup with GI  Prostate Cancer Diagnosed in June 2021 or 2022, currently in remission following surgery and radiation therapy. -Continue follow-up with oncologist -Share results of immune system workup with oncologist.  Follow-up Await results of lab work, which may take a week or more. Contact patient if concerning results are found, otherwise wait until all results are in.       This note in its entirety was forwarded to the Provider who requested this consultation.  Other: none  Thank you for your kind referral. I appreciate the opportunity to take part in Alandis's care. Please do not hesitate to contact me with questions.  Sincerely,  Tonny Bollman, MD [facility]

## 2022-11-29 NOTE — Patient Instructions (Signed)
Elevated Antibody Levels Mildly elevated levels, not typically concerning. Discussed the possibility of overproduction of antibodies, which would require oncology referral due to possible lymphoproliferative disorders- -Order additional blood and urine tests to further evaluate the immune system. -If abnormal results are found, refer to oncology.  Chronic Diarrhea Ongoing for months, no clear cause identified by gastroenterology. No associated symptoms of infection or antibiotic use. -No immediate changes to management. -Consider followup with GI  Prostate Cancer Diagnosed in June 2021 or 2022, currently in remission following surgery and radiation therapy. -Continue follow-up with oncologist -Share results of immune system workup with oncologist.  Follow-up Await results of lab work, which may take a week or more. Contact patient if concerning results are found, otherwise wait until all results are in.

## 2022-12-04 LAB — LYMPH ENUMERATION, BASIC & NK CELLS
% CD 3 Pos. Lymph.: 51.3 % — ABNORMAL LOW (ref 57.5–86.2)
% CD 4 Pos. Lymph.: 34.8 % (ref 30.8–58.5)
% NK (CD56/16): 20.7 % — ABNORMAL HIGH (ref 1.4–19.4)
Ab NK (CD56/16): 145 /uL (ref 24–406)
Absolute CD 3: 359 /uL — ABNORMAL LOW (ref 622–2402)
Absolute CD 4 Helper: 244 /uL — ABNORMAL LOW (ref 359–1519)
Basophils Absolute: 0 10*3/uL (ref 0.0–0.2)
Basos: 1 %
CD19 % B Cell: 26.2 % — ABNORMAL HIGH (ref 3.3–25.4)
CD19 Abs: 183 /uL (ref 12–645)
CD4/CD8 Ratio: 2.18 (ref 0.92–3.72)
CD8 % Suppressor T Cell: 16 % (ref 12.0–35.5)
CD8 T Cell Abs: 112 /uL (ref 109–897)
EOS (ABSOLUTE): 0.1 10*3/uL (ref 0.0–0.4)
Eos: 3 %
Hematocrit: 42.6 % (ref 37.5–51.0)
Hemoglobin: 13.8 g/dL (ref 13.0–17.7)
Immature Grans (Abs): 0 10*3/uL (ref 0.0–0.1)
Immature Granulocytes: 1 %
Lymphocytes Absolute: 0.7 10*3/uL (ref 0.7–3.1)
Lymphs: 23 %
MCH: 33.3 pg — ABNORMAL HIGH (ref 26.6–33.0)
MCHC: 32.4 g/dL (ref 31.5–35.7)
MCV: 103 fL — ABNORMAL HIGH (ref 79–97)
Monocytes Absolute: 0.5 10*3/uL (ref 0.1–0.9)
Monocytes: 17 %
Neutrophils Absolute: 1.7 10*3/uL (ref 1.4–7.0)
Neutrophils: 55 %
Platelets: 239 10*3/uL (ref 150–450)
RBC: 4.14 x10E6/uL (ref 4.14–5.80)
RDW: 12 % (ref 11.6–15.4)
WBC: 3 10*3/uL — ABNORMAL LOW (ref 3.4–10.8)

## 2022-12-04 LAB — STREP PNEUMONIAE 23 SEROTYPES IGG
Pneumo Ab Type 1*: 0.9 ug/mL — ABNORMAL LOW (ref >1.3)
Pneumo Ab Type 12 (12F)*: 2.5 ug/mL (ref >1.3)
Pneumo Ab Type 14*: 1.5 ug/mL (ref >1.3)
Pneumo Ab Type 17 (17F)*: 1.3 ug/mL — ABNORMAL LOW (ref >1.3)
Pneumo Ab Type 19 (19F)*: 0.7 ug/mL — ABNORMAL LOW (ref >1.3)
Pneumo Ab Type 2*: 0.5 ug/mL — ABNORMAL LOW (ref >1.3)
Pneumo Ab Type 20*: 1.9 ug/mL (ref >1.3)
Pneumo Ab Type 22 (22F)*: 0.7 ug/mL — ABNORMAL LOW (ref >1.3)
Pneumo Ab Type 23 (23F)*: 0.3 ug/mL — ABNORMAL LOW (ref >1.3)
Pneumo Ab Type 26 (6B)*: 0.8 ug/mL — ABNORMAL LOW (ref >1.3)
Pneumo Ab Type 3*: 0.6 ug/mL — ABNORMAL LOW (ref >1.3)
Pneumo Ab Type 34 (10A)*: 3.3 ug/mL (ref >1.3)
Pneumo Ab Type 4*: 0.8 ug/mL — ABNORMAL LOW (ref >1.3)
Pneumo Ab Type 43 (11A)*: <0.1 ug/mL — ABNORMAL LOW (ref >1.3)
Pneumo Ab Type 5*: 0.6 ug/mL — ABNORMAL LOW (ref >1.3)
Pneumo Ab Type 51 (7F)*: 0.9 ug/mL — ABNORMAL LOW (ref >1.3)
Pneumo Ab Type 54 (15B)*: 1.7 ug/mL (ref >1.3)
Pneumo Ab Type 56 (18C)*: 2.3 ug/mL (ref >1.3)
Pneumo Ab Type 57 (19A)*: 2.6 ug/mL (ref >1.3)
Pneumo Ab Type 68 (9V)*: 1.3 ug/mL — ABNORMAL LOW (ref >1.3)
Pneumo Ab Type 70 (33F)*: 0.9 ug/mL — ABNORMAL LOW (ref >1.3)
Pneumo Ab Type 8*: <0.3 ug/mL — ABNORMAL LOW (ref >1.3)
Pneumo Ab Type 9 (9N)*: 1.1 ug/mL — ABNORMAL LOW (ref >1.3)

## 2022-12-04 LAB — PROTEIN ELECTROPHORESIS, SERUM
A/G Ratio: 1.1 (ref 0.7–1.7)
Albumin ELP: 3.7 g/dL (ref 2.9–4.4)
Alpha 1: 0.2 g/dL (ref 0.0–0.4)
Alpha 2: 0.7 g/dL (ref 0.4–1.0)
Beta: 1.1 g/dL (ref 0.7–1.3)
Gamma Globulin: 1.6 g/dL (ref 0.4–1.8)
Globulin, Total: 3.5 g/dL (ref 2.2–3.9)
Total Protein: 7.2 g/dL (ref 6.0–8.5)

## 2022-12-04 LAB — DIPHTHERIA / TETANUS ANTIBODY PANEL
Diphtheria Ab: 0.66 [IU]/mL (ref <0.10)
Tetanus Ab, IgG: 6.81 [IU]/mL (ref <0.10)

## 2022-12-04 LAB — COMPLEMENT, TOTAL: Compl, Total (CH50): >60 U/mL (ref >41)

## 2022-12-09 LAB — UPEP/TP, 24-HR URINE
Albumin, U: 0 %
Alpha 1, Urine: 0 %
Alpha 2, Urine: 0 %
Beta, Urine: 0 %
Gamma Globulin, Urine: 0 %
Protein, 24H Urine: 71 mg/(24.h) (ref 30–150)
Protein, Ur: 4.3 mg/dL

## 2022-12-10 NOTE — Progress Notes (Signed)
Called to discuss results with patient. He did not answer, left generic VM for call back.  Please relay to him the following:   Some of your immune cells, called CD4 cells, are a little lower than normal. This might be because of the medications you've taken for prostate cancer. Another possible reason could be HIV, though we didn't discuss any risk factors for that during your visit.  To be thorough, I recommend getting an HIV test. If it's negative, I would let your cancer doctor know about the lower CD4 count so they can monitor it over time. Your levels aren't low enough to need antibiotics to prevent infections right now, but we should keep an eye on them over the next year to make sure they don't drop further.  The rest of your lab results look good. However, your body doesn't have strong protection against a type of bacteria called strep pneumonia. If you haven't already received the Pneumovax 23 vaccine, I recommend getting it.
